# Patient Record
Sex: Male | Born: 1975 | Race: White | Hispanic: No | Marital: Single | Smoking: Current every day smoker
Health system: Southern US, Community
[De-identification: ages and names within clinical notes are randomized; demographics above are authoritative.]

## PROBLEM LIST (undated history)

## (undated) DIAGNOSIS — F250 Schizoaffective disorder, bipolar type: Secondary | ICD-10-CM

## (undated) DIAGNOSIS — K219 Gastro-esophageal reflux disease without esophagitis: Secondary | ICD-10-CM

## (undated) DIAGNOSIS — K259 Gastric ulcer, unspecified as acute or chronic, without hemorrhage or perforation: Secondary | ICD-10-CM

## (undated) DIAGNOSIS — F200 Paranoid schizophrenia: Secondary | ICD-10-CM

## (undated) DIAGNOSIS — I10 Essential (primary) hypertension: Secondary | ICD-10-CM

## (undated) DIAGNOSIS — F419 Anxiety disorder, unspecified: Secondary | ICD-10-CM

## (undated) HISTORY — PX: KNEE ARTHROSCOPY: SUR90

---

## 2000-03-19 ENCOUNTER — Emergency Department (HOSPITAL_COMMUNITY): Admission: EM | Admit: 2000-03-19 | Discharge: 2000-03-19 | Payer: Self-pay | Admitting: Emergency Medicine

## 2000-03-26 ENCOUNTER — Inpatient Hospital Stay (HOSPITAL_COMMUNITY): Admission: EM | Admit: 2000-03-26 | Discharge: 2000-03-28 | Payer: Self-pay | Admitting: Emergency Medicine

## 2000-03-26 ENCOUNTER — Encounter: Payer: Self-pay | Admitting: Emergency Medicine

## 2007-04-22 ENCOUNTER — Emergency Department (HOSPITAL_COMMUNITY): Admission: EM | Admit: 2007-04-22 | Discharge: 2007-04-23 | Payer: Self-pay | Admitting: Emergency Medicine

## 2008-02-06 ENCOUNTER — Emergency Department (HOSPITAL_COMMUNITY): Admission: EM | Admit: 2008-02-06 | Discharge: 2008-02-06 | Payer: Self-pay | Admitting: Emergency Medicine

## 2011-04-17 NOTE — Procedures (Signed)
[place]. [facility]  Patient:    Cameron Oliver, Cameron Oliver                        MRN: [id number] Proc. Date: 03/26/00 Adm. Date:  [id number] Attending:  Madaline Guthrie CC:         Madaline Guthrie, M.D.                           Procedure Report  PROCEDURE:  Esophagogastroduodenoscopy.  ENDOSCOPIST:  Everardo All. Madilyn Fireman, M.D.  ANESTHESIA:  INDICATIONS FOR PROCEDURE:  Nausea and vomiting of 9-10 days duration with report of recent hematemesis.  DESCRIPTION OF PROCEDURE:  The patient was placed in the left lateral decubitus  position and placed on the pulse monitor with continuous low flow oxygen delivered by nasal cannula.  He was sedated with 75 mg of IV Demerol and 10 mg of IV Versed. The Olympus video endoscope was passed under direct vision to the oropharynx and esophagus.  The esophagus was straight and of normal caliber, but was severely inflamed, especially distally with numerous erosions with exudate extending upward from the GE junction.  The GE junction initially appeared normal and there was o obvious hiatal hernia.  Later in the procedure however, there were several spontaneous relaxations of the LES and a sliding hiatal hernia was noted.  The esophagitis was grade 4 severity.  The squamocolumnar line appeared intact, although irregular at the apparent LES, and there was no obvious Barretts esophagus.  The stomach was entered and a small amount of liquid secretions were suctioned from the fundus.  Retroflexed view of the cardia was unremarkable. The fundus, body, antrum, and pylorus all appeared normal.  The duodenum was entered and both the bulb and second portion were well-inspected and appeared to be within normal limits.  The endoscope was then withdrawn and the patient was returned to the recovery room in stable condition.  He tolerated the procedure well and there were no immediate complications.  IMPRESSION:  Severe distal esophagitis,  presumed from gastroesophageal reflux.  PLAN:  Double dose proton pump inhibitor and IV Reglan. DD:  03/26/00 TD:  03/28/00 Job: [id number]

## 2011-04-17 NOTE — Discharge Summary (Signed)
[place]. [facility]  Patient:    Cameron Oliver, Cameron Oliver                        MRN: [id number] Adm. Date:  [id number] Disc. Date: [id number] Attending:  Madaline Guthrie Dictator:   Nolon Nations, M.D. CC:         Jannette Fogo, M.D.,             [facility]             Everardo All. Madilyn Fireman, M.D.                           Discharge Summary  DATE OF BIRTH:  1976-11-14  SERVICE:  Internal medicine teaching service.  ADMITTING DIAGNOSES: 1. Epigastric pain with hematemesis. 2. Schizophrenia. 3. Hypokalemia.  DISCHARGE DIAGNOSES: 1. Severe esophagitis. 2. Hiatal hernia. 3. Schizophrenia.  CONSULTS:  GI.  PROCEDURES: 1. Esophagogastroduodenoscopy. 2. Acute abdominal series. 3. CT of the abdomen.  HISTORY OF PRESENT ILLNESS:  Cameron Oliver is a 35 year old male with a 10-day history of nausea and vomiting 10 to 15 times a day.  He also had upper abdominal chest pain which was worse with vomiting.  He had one episode of hematemesis the day before admission.  His events were preceded by heavy alcohol consumption.  He had no diarrhea, constipation, melena, or hematochezia.  He was seen in the ED in [place] twice prior to admission and at [facility] once.  He was given Phenergan and H2 blockers along with a PPI without much relief.  He does report dysphagia and odynophagia.  PAST MEDICAL HISTORY:  Significant for schizophrenia treated with Haldol injections q. month as well as Elavil q.h.s., ? Cogentin.  ADMISSION LABS:  Significant for a potassium of 2.8, lipase 21, amylase 79. An acute abdominal series revealed no free air, no signs of obstruction, no perforation, or air fluid levels.  His CT of the abdomen showed some mild colonic thickening but was otherwise normal.  HOSPITAL COURSE:  Cameron Oliver was admitted with a differential diagnosis including a Mallory-Weiss tear versus an alcohol or drug induced gastritis/esophagitis.  GI was  consulted while hydration was begun along with Phenergan for vomiting, and a PPI.  He did have an elevated T. bili, ALT, which was likely secondary to hemoconcentration or effect of Haldol.  GI performed an EGD which showed severe esophagitis as well as a hiatal hernia. They recommended double dose PPI with Reglan.  He was started on a full-liquid diet with advancement as tolerated.  He did quite well and was discharged on April 29.  The teaching service recommended one more day of inpatient treatment to follow a hemoglobin which went from 18.6 on admission to 14.3 on day of discharge.  However, the patient insisted on leaving.  Patient was instructed to return if he was feeling weak or dizzy or began with repeat vomiting.  He was sent home with a prescription for Protonix plus two samples and call for follow-up with Dr. Barbaraann Rondo in two to three weeks.  DISCHARGE CONDITION:  Stable.  Discharge to home.  DISCHARGE MEDICATIONS: 1. Protonix 400 mg one p.o. q.d. 2. Haldol injection q. month, next one due Apr 24, 2000. 3. Elavil 100 mg q.h.s. 4. Cogentin, continue home dose.  FOLLOW-UP:  [facility] outpatient [facility] for follow-up with Dr. Barbaraann Rondo in two to three weeks. DD:  04/04/00 TD:  04/05/00 Job: [id number]

## 2011-08-24 LAB — WOUND CULTURE

## 2014-04-05 ENCOUNTER — Emergency Department (HOSPITAL_COMMUNITY)
Admission: EM | Admit: 2014-04-05 | Discharge: 2014-04-05 | Disposition: A | Discharge status: Home / Self Care | Payer: Medicare Other | Attending: Emergency Medicine | Admitting: Emergency Medicine

## 2014-04-05 ENCOUNTER — Encounter (HOSPITAL_COMMUNITY): Payer: Self-pay | Admitting: Emergency Medicine

## 2014-04-05 ENCOUNTER — Emergency Department (HOSPITAL_COMMUNITY): Payer: Medicare Other

## 2014-04-05 DIAGNOSIS — IMO0002 Reserved for concepts with insufficient information to code with codable children: Secondary | ICD-10-CM | POA: Insufficient documentation

## 2014-04-05 DIAGNOSIS — X500XXA Overexertion from strenuous movement or load, initial encounter: Secondary | ICD-10-CM | POA: Insufficient documentation

## 2014-04-05 DIAGNOSIS — Y929 Unspecified place or not applicable: Secondary | ICD-10-CM | POA: Insufficient documentation

## 2014-04-05 DIAGNOSIS — Y9389 Activity, other specified: Secondary | ICD-10-CM | POA: Insufficient documentation

## 2014-04-05 DIAGNOSIS — S86919A Strain of unspecified muscle(s) and tendon(s) at lower leg level, unspecified leg, initial encounter: Secondary | ICD-10-CM

## 2014-04-05 HISTORY — DX: Paranoid schizophrenia: F20.0

## 2014-04-05 HISTORY — DX: Gastric ulcer, unspecified as acute or chronic, without hemorrhage or perforation: K25.9

## 2014-04-05 HISTORY — DX: Essential (primary) hypertension: I10

## 2014-04-05 HISTORY — DX: Gastro-esophageal reflux disease without esophagitis: K21.9

## 2014-04-05 MED ORDER — HYDROCODONE-ACETAMINOPHEN 5-325 MG PO TABS: 2.0000 | ORAL_TABLET | ORAL | Status: DC | PRN

## 2014-04-05 MED ORDER — IBUPROFEN 800 MG PO TABS: 800.0000 mg | ORAL_TABLET | Freq: Three times a day (TID) | ORAL | Status: DC

## 2014-04-05 MED ORDER — HYDROCODONE-ACETAMINOPHEN 5-325 MG PO TABS
2.0000 | ORAL_TABLET | Freq: Once | ORAL | Status: AC
  Administered 2014-04-05: 2 via ORAL
  Filled 2014-04-05: qty 2

## 2014-04-05 NOTE — ED Notes (Addendum)
Pt from home, c/o knee pain after hearing pop in left knee, Pt unable to straighten knee fully. No gross deformities noted. Pt received 150 mcg fentanyl

## 2014-04-05 NOTE — Discharge Instructions (Signed)
Ligament Sprain A ligament sprain is when the bands of tissue that hold bones together (ligament) are stretched. HOME CARE   Rest the injured area.  Start using the joint when told to by your doctor.  Keep the injured area raised (elevated) above the level of the heart. This may lessen puffiness (swelling).  Put ice on the injured area.  Put ice in a plastic bag.  Place a towel between your skin and the bag.  Leave the ice on for 15-20 minutes, 03-04 times a day.  Wear a splint, cast, or an elastic bandage as told by your doctor.  Only take medicine as told by your doctor.  Use crutches as told by your doctor. Do not put weight on the injured joint until told to by your doctor. GET HELP RIGHT AWAY IF:   You have more bruising, puffiness, or pain.  The leg was injured and the toes are cold, tingling, numb, or blue.  The arm was injured and the fingers are cold, tingling, numb, or blue.  The pain is not helped with medicine.  The pain gets worse. MAKE SURE YOU:   Understand these instructions.  Will watch this condition.  Will get help right away if you are not doing well or get worse. Document Released: 05/04/2008 Document Revised: 09/06/2013 Document Reviewed: 05/04/2008 [facility] Patient Information 2014 [place]Care, [place]. Knee, Cartilage and its Function The menisci are made of tough cartilage, and fit between the surfaces of the bones upon which they rest. The menisci are semi lunar (C-shaped) and have a wedged profile. The wedged profile helps the stability of the joint by keeping the rounded femur surface from sliding off the flat tibial surface. The menisci are nourished (fed) by small blood vessels; but there is also a large area in the center of the meniscus that does not have a good blood supply (avascular). This presents a problem when there is an injury to the meniscus, as areas without good blood supply heal poorly. When there is a torn cartilage in the knee,  surgery is often required to fix this. This is usually done with an arthroscopy (a surgical procedure less invasive than open surgery). Some times open surgery of the knee is required if there is other damage which has occurred. The medial meniscus rests on the medial tibial plateau. The tibia is the large bone in your lower leg (the shin bone). The medial tibial plateau is the upper end of the bone making up the inner part of your knee. The lateral meniscus serves the same purpose and is located on the outside of the knee. The menisci help to distribute your body weight across the knee joint. Without the meniscus present, the weight of your body would be unevenly applied to the bones in your legs (the femur and tibia). The femur is the large bone in your thigh. This uneven weight distribution would cause increased wear and tear on the cartilage covering the bones, leading to early damage of these areas. The presence of the menisci cartilage is necessary for a healthy knee. PURPOSE OF THE KNEE CARTILAGE The knee joint is made up of three bones: the thigh bone (femur), the shin bone (tibia), and the knee cap (patella). The surfaces of these bones at the knee joint are covered with cartilage. This smooth, slippery surface allows the bones to slide against each other without causing bone damage. The meniscus sits between these cartilaginous surfaces of the bones (femur and tibia). It distributes the weight  evenly in the joints and helps with the stability of the joint (keeps the joint steady). HOME CARE INSTRUCTIONS After surgery:  Use crutches as instructed.  Once home, an ice pack applied to your injury may help with discomfort and keep the swelling down. An ice pack can be used for 15-20 minutes 03-04 times per day for the first 2-3 days or as instructed.  Only take over-the-counter or prescription medicines for pain, discomfort, or fever as directed by your caregiver.  Call if you do not have relief of  pain with medications or if there is an increase in pain.  Call if your foot becomes cold or blue.  Call if your knee gets stuck in a fixed position and will not move.  You may resume normal diet and activities as directed.  Make sure to keep your appointment with your follow-up caregiver. This injury may require further evaluation and treatment beyond the treatment you received today. MAKE SURE YOU:   Understand these instructions.  Will watch your condition.  Will get help right away if you are not doing well or get worse. Document Released: 05/08/2002 Document Revised: 02/08/2012 Document Reviewed: 09/18/2009 [facility]oniumExitCare Patient Information 2014 [place]ExitCare, [place]LL[place].

## 2014-04-05 NOTE — ED Provider Notes (Addendum)
CSN: [id number]     Arrival date & time 04/05/14  1749 History   First MD Initiated Contact with Patient 04/05/14 1750     No chief complaint on file.    (Consider location/radiation/quality/duration/timing/severity/associated sxs/prior Treatment) HPI Comments: Patient presents to the ER by him as her knee injury. Patient reports that he stood up from a sitting position and felt a pop in his right knee, then the knee was locked up. He cannot bend it. Patient reports severe pain on the outside and lower portion of the knee. It worsens with attempts to move the knee.   No past medical history on file. No past surgical history on file. No family history on file. History  Substance Use Topics  . Smoking status: Not on file  . Smokeless tobacco: Not on file  . Alcohol Use: Not on file    Review of Systems  Musculoskeletal: Positive for arthralgias.  Skin: Negative.   All other systems reviewed and are negative.     Allergies  Review of patient's allergies indicates not on file.  Home Medications   Prior to Admission medications   Not on File   BP 149/85  Pulse 93  Temp(Src) 98.6 F (37 C) (Oral)  Resp 18  SpO2 97% Physical Exam  Constitutional: He is oriented to person, place, and time. He appears well-developed and well-nourished. No distress.  HENT:  Head: Normocephalic and atraumatic.  Right Ear: Hearing normal.  Left Ear: Hearing normal.  Nose: Nose normal.  Mouth/Throat: Oropharynx is clear and moist and mucous membranes are normal.  Eyes: Conjunctivae and EOM are normal. Pupils are equal, round, and reactive to light.  Neck: Normal range of motion. Neck supple.  Cardiovascular: Regular rhythm, S1 normal and S2 normal.  Exam reveals no gallop and no friction rub.   No murmur heard. Pulses:      Dorsalis pedis pulses are 2+ on the right side.  Pulmonary/Chest: Effort normal and breath sounds normal. No respiratory distress. He exhibits no tenderness.   Abdominal: Soft. Normal appearance and bowel sounds are normal. There is no hepatosplenomegaly. There is no tenderness. There is no rebound, no guarding, no tenderness at McBurney's point and negative Murphy's sign. No hernia.  Musculoskeletal:       Right shoulder: Decreased range of motion: Painful inhibition.       Right knee: He exhibits decreased range of motion (Painful inhibition). He exhibits no swelling, no effusion, no ecchymosis, no deformity, no erythema and normal alignment. Tenderness found. Lateral joint line tenderness noted.  Able to actively extend the knee, normal quadricep tendon function  Neurological: He is alert and oriented to person, place, and time. He has normal strength. No cranial nerve deficit or sensory deficit. Coordination normal. GCS eye subscore is 4. GCS verbal subscore is 5. GCS motor subscore is 6.  Skin: Skin is warm, dry and intact. No rash noted. No cyanosis.  Psychiatric: He has a normal mood and affect. His speech is normal and behavior is normal. Thought content normal.    ED Course  Procedures (including critical care time) Labs Review Labs Reviewed - No data to display  Imaging Review No results found.   EKG Interpretation None      MDM   Final diagnoses:  None    Patient presents to the ER for evaluation of acute onset right knee pain. Patient reports that he tried to stand up and felt sudden pain and locking of the right knee. Examination, however, is  fairly unremarkable. No swelling, redness or effusion. No concern for septic joint. There is no concern for dislocation based on his exam in the stated history. X-ray was performed and was negative. History is most consistent with meniscus tear, will treat with analgesia, and knee immobilizer, followup in orthopedics.    Gilda Creasehristopher J. Jesusita Jocelyn, MD 04/05/14 Serena Croissant1928  Gilda Creasehristopher J. Revel Stellmach, MD 04/05/14 [phone]

## 2014-04-05 NOTE — ED Notes (Signed)
Discharge instructions reviewed with pt. Pt verbalized understanding.   

## 2014-11-25 ENCOUNTER — Encounter (HOSPITAL_COMMUNITY): Payer: Self-pay

## 2014-11-25 ENCOUNTER — Emergency Department (HOSPITAL_COMMUNITY)
Admission: EM | Admit: 2014-11-25 | Discharge: 2014-11-25 | Disposition: A | Discharge status: Home / Self Care | Payer: Medicare Other | Attending: Emergency Medicine | Admitting: Emergency Medicine

## 2014-11-25 DIAGNOSIS — F200 Paranoid schizophrenia: Secondary | ICD-10-CM | POA: Insufficient documentation

## 2014-11-25 DIAGNOSIS — Z720 Tobacco use: Secondary | ICD-10-CM | POA: Diagnosis not present

## 2014-11-25 DIAGNOSIS — R51 Headache: Secondary | ICD-10-CM | POA: Diagnosis present

## 2014-11-25 DIAGNOSIS — G43909 Migraine, unspecified, not intractable, without status migrainosus: Secondary | ICD-10-CM | POA: Diagnosis not present

## 2014-11-25 DIAGNOSIS — K219 Gastro-esophageal reflux disease without esophagitis: Secondary | ICD-10-CM | POA: Diagnosis not present

## 2014-11-25 DIAGNOSIS — I10 Essential (primary) hypertension: Secondary | ICD-10-CM | POA: Diagnosis not present

## 2014-11-25 DIAGNOSIS — Z79899 Other long term (current) drug therapy: Secondary | ICD-10-CM | POA: Diagnosis not present

## 2014-11-25 DIAGNOSIS — R519 Headache, unspecified: Secondary | ICD-10-CM

## 2014-11-25 MED ORDER — ONDANSETRON HCL 4 MG/2ML IJ SOLN
4.0000 mg | Freq: Once | INTRAMUSCULAR | Status: AC
  Administered 2014-11-25: 4 mg via INTRAVENOUS
  Filled 2014-11-25: qty 2

## 2014-11-25 MED ORDER — METOCLOPRAMIDE HCL 10 MG PO TABS: 10.0000 mg | ORAL_TABLET | Freq: Three times a day (TID) | ORAL | Status: DC | PRN

## 2014-11-25 MED ORDER — KETOROLAC TROMETHAMINE 30 MG/ML IJ SOLN
30.0000 mg | Freq: Once | INTRAMUSCULAR | Status: AC
  Administered 2014-11-25: 30 mg via INTRAVENOUS
  Filled 2014-11-25: qty 1

## 2014-11-25 MED ORDER — FENTANYL CITRATE 0.05 MG/ML IJ SOLN
50.0000 ug | Freq: Once | INTRAMUSCULAR | Status: AC
  Administered 2014-11-25: 50 ug via INTRAVENOUS
  Filled 2014-11-25: qty 2

## 2014-11-25 MED ORDER — SODIUM CHLORIDE 0.9 % IV BOLUS (SEPSIS)
1000.0000 mL | Freq: Once | INTRAVENOUS | Status: AC
  Administered 2014-11-25: 1000 mL via INTRAVENOUS

## 2014-11-25 MED ORDER — METOCLOPRAMIDE HCL 5 MG/ML IJ SOLN
10.0000 mg | Freq: Once | INTRAMUSCULAR | Status: AC
  Administered 2014-11-25: 10 mg via INTRAVENOUS
  Filled 2014-11-25: qty 2

## 2014-11-25 MED ORDER — DIPHENHYDRAMINE HCL 50 MG/ML IJ SOLN
50.0000 mg | Freq: Once | INTRAMUSCULAR | Status: AC
  Administered 2014-11-25: 50 mg via INTRAVENOUS
  Filled 2014-11-25: qty 1

## 2014-11-25 NOTE — ED Notes (Signed)
Per EMS: Pt reports HA that started at 2300 last night with nausea and vomiting and feeling dizzy. Pt was given 4mg  zofran en route. NS on monitor. Pt rates pain 7/10. Denies any sensitivity to light or noise.

## 2014-11-25 NOTE — ED Provider Notes (Signed)
CSN: [id number]     Arrival date & time 11/25/14  0447 History   First MD Initiated Contact with Patient 11/25/14 0636     Chief Complaint  Patient presents with  . Headache     (Consider location/radiation/quality/duration/timing/severity/associated sxs/prior Treatment) HPI  Cameron Oliver is a 38 y.o. male with past medical history of migraines, hypertension presenting today with headache. Patient states this began last night at 11 PM and is typical of his normal migraines. He states she's had other headaches that worsened this one. He vomited 3 or 4 times without any blood. She also admits to being dizzy. He denies neurological symptoms with blurry vision muscle weakness or numbness tingling. Patient is having no fevers or recent illness. Nothing has made this better or worse.  He has no further complaints.   10 Systems reviewed and are negative for acute change except as noted in the HPI.    Past Medical History  Diagnosis Date  . Hypertension   . Stomach ulcer   . GERD (gastroesophageal reflux disease)   . Schizophrenia, paranoid    History reviewed. No pertinent past surgical history. History reviewed. No pertinent family history. History  Substance Use Topics  . Smoking status: Current Every Day Smoker -- 1.00 packs/day    Types: Cigarettes  . Smokeless tobacco: Not on file  . Alcohol Use: No    Review of Systems    Allergies  Bee venom  Home Medications   Prior to Admission medications   Medication Sig Start Date End Date Taking? Authorizing Provider  acetaminophen (TYLENOL) 500 MG tablet Take 1,000 mg by mouth every 4 (four) hours as needed for mild pain.   Yes Historical Provider, MD  ARIPiprazole (ABILIFY) 5 MG tablet Take 5 mg by mouth daily.   Yes Historical Provider, MD  busPIRone (BUSPAR) 7.5 MG tablet Take 7.5 mg by mouth 2 (two) times daily.   Yes Historical Provider, MD  cyclobenzaprine (FLEXERIL) 10 MG tablet Take 10 mg by mouth at bedtime.   Yes  Historical Provider, MD  lisinopril (PRINIVIL,ZESTRIL) 20 MG tablet Take 20 mg by mouth daily.   Yes Historical Provider, MD  omeprazole (PRILOSEC) 20 MG capsule Take 20 mg by mouth daily.   Yes Historical Provider, MD  HYDROcodone-acetaminophen (NORCO/VICODIN) 5-325 MG per tablet Take 2 tablets by mouth every 4 (four) hours as needed for moderate pain. Patient not taking: Reported on 11/25/2014 04/05/14   Gilda Creasehristopher J. Pollina, MD  ibuprofen (ADVIL,MOTRIN) 800 MG tablet Take 1 tablet (800 mg total) by mouth 3 (three) times daily. Patient not taking: Reported on 11/25/2014 04/05/14   Gilda Creasehristopher J. Pollina, MD   BP 149/87 mmHg  Pulse 66  Temp(Src) 98.7 F (37.1 C) (Oral)  Resp 18  SpO2 97% Physical Exam  Constitutional: He is oriented to person, place, and time. Vital signs are normal. He appears well-developed and well-nourished.  Non-toxic appearance. He does not appear ill. No distress.  HENT:  Head: Normocephalic and atraumatic.  Nose: Nose normal.  Mouth/Throat: Oropharynx is clear and moist. No oropharyngeal exudate.  Eyes: Conjunctivae and EOM are normal. Pupils are equal, round, and reactive to light. No scleral icterus.  Neck: Normal range of motion. Neck supple. No tracheal deviation, no edema, no erythema and normal range of motion present. No thyroid mass and no thyromegaly present.  Cardiovascular: Normal rate, regular rhythm, S1 normal, S2 normal, normal heart sounds, intact distal pulses and normal pulses.  Exam reveals no gallop and no friction rub.  No murmur heard. Pulses:      Radial pulses are 2+ on the right side, and 2+ on the left side.       Dorsalis pedis pulses are 2+ on the right side, and 2+ on the left side.  Pulmonary/Chest: Effort normal and breath sounds normal. No respiratory distress. He has no wheezes. He has no rhonchi. He has no rales.  Abdominal: Soft. Normal appearance and bowel sounds are normal. He exhibits no distension, no ascites and no mass.  There is no hepatosplenomegaly. There is no tenderness. There is no rebound, no guarding and no CVA tenderness.  Musculoskeletal: Normal range of motion. He exhibits edema. He exhibits no tenderness.  Lymphadenopathy:    He has no cervical adenopathy.  Neurological: He is alert and oriented to person, place, and time. He has normal strength. No cranial nerve deficit or sensory deficit. He exhibits normal muscle tone. GCS eye subscore is 4. GCS verbal subscore is 5. GCS motor subscore is 6.  Normal strength and sensation 4 extremities. Normal cerebellar testing, normal gait.  Skin: Skin is warm, dry and intact. No petechiae and no rash noted. He is not diaphoretic. No erythema. No pallor.  Psychiatric: He has a normal mood and affect. His behavior is normal. Judgment normal.  Nursing note and vitals reviewed.   ED Course  Procedures (including critical care time) Labs Review Labs Reviewed - No data to display  Imaging Review No results found.   EKG Interpretation None      MDM   Final diagnoses:  None    Patient since emergency department for headache which she states is consistent with his normal migraines. He was given Reglan Toradol and Benadryl for treatment. Patient has no red flag symptoms such as fever and he denies his having been more severe than his normal headache.  Upon repeat evaluation patient was found resting comfortably in the room in no acute distress. He states that his headache is much improved and is requesting Reglan tablets to go home with. To exam remains normal. His vital signs were within his normal limits and he is safe for discharge.  Tomasita CrumbleAdeleke Ewen Varnell, MD 11/25/14 [phone]

## 2014-11-25 NOTE — Discharge Instructions (Signed)
Headaches, Frequently Asked Questions Cameron Oliver,  you were seen today for a headache. Take Reglan as prescribed and follow-up with a primary physician within 3 days for continued management. If symptoms worsen come back to emergency department immediately. Thank you. MIGRAINE HEADACHES Q: What is migraine? What causes it? How can I treat it? A: Generally, migraine headaches begin as a dull ache. Then they develop into a constant, throbbing, and pulsating pain. You may experience pain at the temples. You may experience pain at the front or back of one or both sides of the head. The pain is usually accompanied by a combination of:  Nausea.  Vomiting.  Sensitivity to light and noise. Some people (about 15%) experience an aura (see below) before an attack. The cause of migraine is believed to be chemical reactions in the brain. Treatment for migraine may include over-the-counter or prescription medications. It may also include self-help techniques. These include relaxation training and biofeedback.  Q: What is an aura? A: About 15% of people with migraine get an "aura". This is a sign of neurological symptoms that occur before a migraine headache. You may see wavy or jagged lines, dots, or flashing lights. You might experience tunnel vision or blind spots in one or both eyes. The aura can include visual or auditory hallucinations (something imagined). It may include disruptions in smell (such as strange odors), taste or touch. Other symptoms include:  Numbness.  A "pins and needles" sensation.  Difficulty in recalling or speaking the correct word. These neurological events may last as long as 60 minutes. These symptoms will fade as the headache begins. Q: What is a trigger? A: Certain physical or environmental factors can lead to or "trigger" a migraine. These include:  Foods.  Hormonal changes.  Weather.  Stress. It is important to remember that triggers are different for everyone. To  help prevent migraine attacks, you need to figure out which triggers affect you. Keep a headache diary. This is a good way to track triggers. The diary will help you talk to your healthcare professional about your condition. Q: Does weather affect migraines? A: Bright sunshine, hot, humid conditions, and drastic changes in barometric pressure may lead to, or "trigger," a migraine attack in some people. But studies have shown that weather does not act as a trigger for everyone with migraines. Q: What is the link between migraine and hormones? A: Hormones start and regulate many of your body's functions. Hormones keep your body in balance within a constantly changing environment. The levels of hormones in your body are unbalanced at times. Examples are during menstruation, pregnancy, or menopause. That can lead to a migraine attack. In fact, about three quarters of all women with migraine report that their attacks are related to the menstrual cycle.  Q: Is there an increased risk of stroke for migraine sufferers? A: The likelihood of a migraine attack causing a stroke is very remote. That is not to say that migraine sufferers cannot have a stroke associated with their migraines. In persons under age 17, the most common associated factor for stroke is migraine headache. But over the course of a person's normal life span, the occurrence of migraine headache may actually be associated with a reduced risk of dying from cerebrovascular disease due to stroke.  Q: What are acute medications for migraine? A: Acute medications are used to treat the pain of the headache after it has started. Examples over-the-counter medications, NSAIDs, ergots, and triptans.  Q: What are the triptans?  A: Triptans are the newest class of abortive medications. They are specifically targeted to treat migraine. Triptans are vasoconstrictors. They moderate some chemical reactions in the brain. The triptans work on receptors in your brain.  Triptans help to restore the balance of a neurotransmitter called serotonin. Fluctuations in levels of serotonin are thought to be a main cause of migraine.  Q: Are over-the-counter medications for migraine effective? A: Over-the-counter, or "OTC," medications may be effective in relieving mild to moderate pain and associated symptoms of migraine. But you should see your caregiver before beginning any treatment regimen for migraine.  Q: What are preventive medications for migraine? A: Preventive medications for migraine are sometimes referred to as "prophylactic" treatments. They are used to reduce the frequency, severity, and length of migraine attacks. Examples of preventive medications include antiepileptic medications, antidepressants, beta-blockers, calcium channel blockers, and NSAIDs (nonsteroidal anti-inflammatory drugs). Q: Why are anticonvulsants used to treat migraine? A: During the past few years, there has been an increased interest in antiepileptic drugs for the prevention of migraine. They are sometimes referred to as "anticonvulsants". Both epilepsy and migraine may be caused by similar reactions in the brain.  Q: Why are antidepressants used to treat migraine? A: Antidepressants are typically used to treat people with depression. They may reduce migraine frequency by regulating chemical levels, such as serotonin, in the brain.  Q: What alternative therapies are used to treat migraine? A: The term "alternative therapies" is often used to describe treatments considered outside the scope of conventional Western medicine. Examples of alternative therapy include acupuncture, acupressure, and yoga. Another common alternative treatment is herbal therapy. Some herbs are believed to relieve headache pain. Always discuss alternative therapies with your caregiver before proceeding. Some herbal products contain arsenic and other toxins. TENSION HEADACHES Q: What is a tension-type headache? What  causes it? How can I treat it? A: Tension-type headaches occur randomly. They are often the result of temporary stress, anxiety, fatigue, or anger. Symptoms include soreness in your temples, a tightening band-like sensation around your head (a "vice-like" ache). Symptoms can also include a pulling feeling, pressure sensations, and contracting head and neck muscles. The headache begins in your forehead, temples, or the back of your head and neck. Treatment for tension-type headache may include over-the-counter or prescription medications. Treatment may also include self-help techniques such as relaxation training and biofeedback. CLUSTER HEADACHES Q: What is a cluster headache? What causes it? How can I treat it? A: Cluster headache gets its name because the attacks come in groups. The pain arrives with little, if any, warning. It is usually on one side of the head. A tearing or bloodshot eye and a runny nose on the same side of the headache may also accompany the pain. Cluster headaches are believed to be caused by chemical reactions in the brain. They have been described as the most severe and intense of any headache type. Treatment for cluster headache includes prescription medication and oxygen. SINUS HEADACHES Q: What is a sinus headache? What causes it? How can I treat it? A: When a cavity in the bones of the face and skull (a sinus) becomes inflamed, the inflammation will cause localized pain. This condition is usually the result of an allergic reaction, a tumor, or an infection. If your headache is caused by a sinus blockage, such as an infection, you will probably have a fever. An x-ray will confirm a sinus blockage. Your caregiver's treatment might include antibiotics for the infection, as well as antihistamines or  decongestants.  REBOUND HEADACHES Q: What is a rebound headache? What causes it? How can I treat it? A: A pattern of taking acute headache medications too often can lead to a condition  known as "rebound headache." A pattern of taking too much headache medication includes taking it more than 2 days per week or in excessive amounts. That means more than the label or a caregiver advises. With rebound headaches, your medications not only stop relieving pain, they actually begin to cause headaches. Doctors treat rebound headache by tapering the medication that is being overused. Sometimes your caregiver will gradually substitute a different type of treatment or medication. Stopping may be a challenge. Regularly overusing a medication increases the potential for serious side effects. Consult a caregiver if you regularly use headache medications more than 2 days per week or more than the label advises. ADDITIONAL QUESTIONS AND ANSWERS Q: What is biofeedback? A: Biofeedback is a self-help treatment. Biofeedback uses special equipment to monitor your body's involuntary physical responses. Biofeedback monitors:  Breathing.  Pulse.  Heart rate.  Temperature.  Muscle tension.  Brain activity. Biofeedback helps you refine and perfect your relaxation exercises. You learn to control the physical responses that are related to stress. Once the technique has been mastered, you do not need the equipment any more. Q: Are headaches hereditary? A: Four out of five (80%) of people that suffer report a family history of migraine. Scientists are not sure if this is genetic or a family predisposition. Despite the uncertainty, a child has a 50% chance of having migraine if one parent suffers. The child has a 75% chance if both parents suffer.  Q: Can children get headaches? A: By the time they reach high school, most young people have experienced some type of headache. Many safe and effective approaches or medications can prevent a headache from occurring or stop it after it has begun.  Q: What type of doctor should I see to diagnose and treat my headache? A: Start with your primary caregiver. Discuss  his or her experience and approach to headaches. Discuss methods of classification, diagnosis, and treatment. Your caregiver may decide to recommend you to a headache specialist, depending upon your symptoms or other physical conditions. Having diabetes, allergies, etc., may require a more comprehensive and inclusive approach to your headache. The National Headache Foundation will provide, upon request, a list of [facility] physician members in your state. Document Released: 02/06/2004 Document Revised: 02/08/2012 Document Reviewed: 07/16/2008 [facility] Patient Information 2015 [place], [place]. This information is not intended to replace advice given to you by your health care provider. Make sure you discuss any questions you have with your health care provider.

## 2014-11-25 NOTE — ED Notes (Signed)
Bed: [id number] Expected date: 11/25/14 Expected time:  Means of arrival:  Comments: EMS HA/N/V

## 2015-08-10 ENCOUNTER — Inpatient Hospital Stay (HOSPITAL_COMMUNITY): Payer: Medicare Other

## 2015-08-10 ENCOUNTER — Inpatient Hospital Stay (HOSPITAL_COMMUNITY)
Admission: EM | Admit: 2015-08-10 | Discharge: 2015-08-12 | DRG: 683 | Disposition: A | Discharge status: Home / Self Care | Payer: Medicare Other | Attending: Internal Medicine | Admitting: Internal Medicine

## 2015-08-10 ENCOUNTER — Emergency Department (HOSPITAL_COMMUNITY): Payer: Medicare Other

## 2015-08-10 ENCOUNTER — Encounter (HOSPITAL_COMMUNITY): Payer: Self-pay

## 2015-08-10 DIAGNOSIS — F419 Anxiety disorder, unspecified: Secondary | ICD-10-CM

## 2015-08-10 DIAGNOSIS — R7989 Other specified abnormal findings of blood chemistry: Secondary | ICD-10-CM | POA: Diagnosis present

## 2015-08-10 DIAGNOSIS — F17210 Nicotine dependence, cigarettes, uncomplicated: Secondary | ICD-10-CM | POA: Diagnosis present

## 2015-08-10 DIAGNOSIS — E860 Dehydration: Secondary | ICD-10-CM | POA: Diagnosis present

## 2015-08-10 DIAGNOSIS — F259 Schizoaffective disorder, unspecified: Secondary | ICD-10-CM

## 2015-08-10 DIAGNOSIS — I10 Essential (primary) hypertension: Secondary | ICD-10-CM

## 2015-08-10 DIAGNOSIS — F200 Paranoid schizophrenia: Secondary | ICD-10-CM | POA: Diagnosis not present

## 2015-08-10 DIAGNOSIS — I951 Orthostatic hypotension: Secondary | ICD-10-CM | POA: Diagnosis present

## 2015-08-10 DIAGNOSIS — R0602 Shortness of breath: Secondary | ICD-10-CM

## 2015-08-10 DIAGNOSIS — R0600 Dyspnea, unspecified: Secondary | ICD-10-CM

## 2015-08-10 DIAGNOSIS — D72829 Elevated white blood cell count, unspecified: Secondary | ICD-10-CM | POA: Diagnosis present

## 2015-08-10 DIAGNOSIS — N179 Acute kidney failure, unspecified: Secondary | ICD-10-CM | POA: Diagnosis present

## 2015-08-10 DIAGNOSIS — K921 Melena: Secondary | ICD-10-CM | POA: Diagnosis present

## 2015-08-10 DIAGNOSIS — R55 Syncope and collapse: Secondary | ICD-10-CM | POA: Diagnosis present

## 2015-08-10 DIAGNOSIS — K219 Gastro-esophageal reflux disease without esophagitis: Secondary | ICD-10-CM | POA: Diagnosis present

## 2015-08-10 DIAGNOSIS — F250 Schizoaffective disorder, bipolar type: Secondary | ICD-10-CM

## 2015-08-10 DIAGNOSIS — K210 Gastro-esophageal reflux disease with esophagitis: Secondary | ICD-10-CM | POA: Diagnosis not present

## 2015-08-10 DIAGNOSIS — R112 Nausea with vomiting, unspecified: Secondary | ICD-10-CM

## 2015-08-10 HISTORY — DX: Schizoaffective disorder, unspecified: F25.9

## 2015-08-10 HISTORY — DX: Essential (primary) hypertension: I10

## 2015-08-10 HISTORY — DX: Anxiety disorder, unspecified: F41.9

## 2015-08-10 HISTORY — DX: Schizoaffective disorder, bipolar type: F25.0

## 2015-08-10 LAB — BASIC METABOLIC PANEL
Anion gap: 17 — ABNORMAL HIGH (ref 5–15)
Anion gap: 7 (ref 5–15)
BUN: 20 mg/dL (ref 6–20)
BUN: 12 mg/dL (ref 6–20)
CHLORIDE: 97 mmol/L — AB (ref 101–111)
CHLORIDE: 106 mmol/L (ref 101–111)
CO2: 26 mmol/L (ref 22–32)
CO2: 26 mmol/L (ref 22–32)
CREATININE: 3.79 mg/dL — AB (ref 0.61–1.24)
CREATININE: 1.79 mg/dL — AB (ref 0.61–1.24)
Calcium: 9.5 mg/dL (ref 8.9–10.3)
Calcium: 8.2 mg/dL — ABNORMAL LOW (ref 8.9–10.3)
GFR calc Af Amer: 22 mL/min — ABNORMAL LOW (ref >60)
GFR calc Af Amer: 54 mL/min — ABNORMAL LOW (ref >60)
GFR calc non Af Amer: 19 mL/min — ABNORMAL LOW (ref >60)
GFR calc non Af Amer: 47 mL/min — ABNORMAL LOW (ref >60)
GLUCOSE: 95 mg/dL (ref 65–99)
Glucose, Bld: 125 mg/dL — ABNORMAL HIGH (ref 65–99)
POTASSIUM: 3.9 mmol/L (ref 3.5–5.1)
Potassium: 4 mmol/L (ref 3.5–5.1)
SODIUM: 139 mmol/L (ref 135–145)
Sodium: 140 mmol/L (ref 135–145)

## 2015-08-10 LAB — CBC
HCT: 48.9 % (ref 39.0–52.0)
HEMATOCRIT: 42.6 % (ref 39.0–52.0)
Hemoglobin: 17.5 g/dL — ABNORMAL HIGH (ref 13.0–17.0)
Hemoglobin: 14.5 g/dL (ref 13.0–17.0)
MCH: 31.8 pg (ref 26.0–34.0)
MCH: 31.3 pg (ref 26.0–34.0)
MCHC: 35.8 g/dL (ref 30.0–36.0)
MCHC: 34 g/dL (ref 30.0–36.0)
MCV: 88.7 fL (ref 78.0–100.0)
MCV: 92 fL (ref 78.0–100.0)
PLATELETS: 376 10*3/uL (ref 150–400)
PLATELETS: 284 10*3/uL (ref 150–400)
RBC: 5.51 MIL/uL (ref 4.22–5.81)
RBC: 4.63 MIL/uL (ref 4.22–5.81)
RDW: 13 % (ref 11.5–15.5)
RDW: 13.3 % (ref 11.5–15.5)
WBC: 20 10*3/uL — ABNORMAL HIGH (ref 4.0–10.5)
WBC: 13.7 10*3/uL — AB (ref 4.0–10.5)

## 2015-08-10 LAB — URINALYSIS, ROUTINE W REFLEX MICROSCOPIC
GLUCOSE, UA: NEGATIVE mg/dL
KETONES UR: NEGATIVE mg/dL
Leukocytes, UA: NEGATIVE
Nitrite: NEGATIVE
Protein, ur: >300 mg/dL — AB
Specific Gravity, Urine: >1.03 — ABNORMAL HIGH (ref 1.005–1.030)
Urobilinogen, UA: 0.2 mg/dL (ref 0.0–1.0)
pH: 5.5 (ref 5.0–8.0)

## 2015-08-10 LAB — HEPATIC FUNCTION PANEL
ALK PHOS: 90 U/L (ref 38–126)
ALT: 25 U/L (ref 17–63)
AST: 22 U/L (ref 15–41)
Albumin: 4.1 g/dL (ref 3.5–5.0)
BILIRUBIN DIRECT: 0.1 mg/dL (ref 0.1–0.5)
BILIRUBIN INDIRECT: 0.7 mg/dL (ref 0.3–0.9)
TOTAL PROTEIN: 7.1 g/dL (ref 6.5–8.1)
Total Bilirubin: 0.8 mg/dL (ref 0.3–1.2)

## 2015-08-10 LAB — TYPE AND SCREEN
ABO/RH(D): O POS
Antibody Screen: NEGATIVE

## 2015-08-10 LAB — URINE MICROSCOPIC-ADD ON

## 2015-08-10 LAB — I-STAT TROPONIN, ED: Troponin i, poc: 0.01 ng/mL (ref 0.00–0.08)

## 2015-08-10 LAB — CK: CK TOTAL: 120 U/L (ref 49–397)

## 2015-08-10 LAB — PHOSPHORUS: Phosphorus: 2.3 mg/dL — ABNORMAL LOW (ref 2.5–4.6)

## 2015-08-10 LAB — TSH: TSH: 0.533 u[IU]/mL (ref 0.350–4.500)

## 2015-08-10 LAB — CBG MONITORING, ED: Glucose-Capillary: 135 mg/dL — ABNORMAL HIGH (ref 65–99)

## 2015-08-10 LAB — D-DIMER, QUANTITATIVE (NOT AT ARMC): D DIMER QUANT: 1.29 ug{FEU}/mL — AB (ref 0.00–0.48)

## 2015-08-10 LAB — I-STAT CG4 LACTIC ACID, ED: LACTIC ACID, VENOUS: 2.73 mmol/L — AB (ref 0.5–2.0)

## 2015-08-10 LAB — ABO/RH: ABO/RH(D): O POS

## 2015-08-10 LAB — MAGNESIUM: MAGNESIUM: 2.1 mg/dL (ref 1.7–2.4)

## 2015-08-10 LAB — POC OCCULT BLOOD, ED: Fecal Occult Bld: NEGATIVE

## 2015-08-10 MED ORDER — PANTOPRAZOLE SODIUM 40 MG PO TBEC
40.0000 mg | DELAYED_RELEASE_TABLET | Freq: Every day | ORAL | Status: DC
  Administered 2015-08-11: 40 mg via ORAL
  Filled 2015-08-10: qty 1

## 2015-08-10 MED ORDER — ACETAMINOPHEN 325 MG PO TABS: 650.0000 mg | ORAL_TABLET | Freq: Four times a day (QID) | ORAL | Status: DC | PRN

## 2015-08-10 MED ORDER — TECHNETIUM TO 99M ALBUMIN AGGREGATED
4.6000 | Freq: Once | INTRAVENOUS | Status: AC | PRN
  Administered 2015-08-10: 4.6 via INTRAVENOUS

## 2015-08-10 MED ORDER — ONDANSETRON HCL 4 MG/2ML IJ SOLN
4.0000 mg | Freq: Once | INTRAMUSCULAR | Status: AC
  Administered 2015-08-10: 4 mg via INTRAVENOUS
  Filled 2015-08-10: qty 2

## 2015-08-10 MED ORDER — TECHNETIUM TC 99M DIETHYLENETRIAME-PENTAACETIC ACID: 36.2000 | Freq: Once | INTRAVENOUS | Status: DC | PRN

## 2015-08-10 MED ORDER — SORBITOL 70 % SOLN: 30.0000 mL | Freq: Every day | Status: DC | PRN

## 2015-08-10 MED ORDER — HEPARIN SODIUM (PORCINE) 5000 UNIT/ML IJ SOLN
5000.0000 [IU] | Freq: Three times a day (TID) | INTRAMUSCULAR | Status: DC
Start: 2015-08-10 — End: 2015-08-11
  Administered 2015-08-10 – 2015-08-11 (×2): 5000 [IU] via SUBCUTANEOUS
  Filled 2015-08-10 (×2): qty 1

## 2015-08-10 MED ORDER — ACETAMINOPHEN 650 MG RE SUPP: 650.0000 mg | Freq: Four times a day (QID) | RECTAL | Status: DC | PRN

## 2015-08-10 MED ORDER — BUSPIRONE HCL 15 MG PO TABS
7.5000 mg | ORAL_TABLET | Freq: Two times a day (BID) | ORAL | Status: DC
  Administered 2015-08-10 – 2015-08-12 (×4): 7.5 mg via ORAL
  Filled 2015-08-10 (×4): qty 1

## 2015-08-10 MED ORDER — BENZTROPINE MESYLATE 1 MG PO TABS
1.0000 mg | ORAL_TABLET | Freq: Every day | ORAL | Status: DC
  Administered 2015-08-10 – 2015-08-12 (×3): 1 mg via ORAL
  Filled 2015-08-10 (×3): qty 1

## 2015-08-10 MED ORDER — IOHEXOL 300 MG/ML  SOLN
25.0000 mL | Freq: Once | INTRAMUSCULAR | Status: AC | PRN
  Administered 2015-08-10: 25 mL via ORAL

## 2015-08-10 MED ORDER — SODIUM CHLORIDE 0.9 % IV BOLUS (SEPSIS)
1000.0000 mL | Freq: Once | INTRAVENOUS | Status: AC
Start: 2015-08-10 — End: 2015-08-10
  Administered 2015-08-10: 1000 mL via INTRAVENOUS

## 2015-08-10 MED ORDER — IPRATROPIUM BROMIDE 0.02 % IN SOLN: 0.5000 mg | RESPIRATORY_TRACT | Status: DC | PRN

## 2015-08-10 MED ORDER — INFLUENZA VAC SPLIT QUAD 0.5 ML IM SUSY
0.5000 mL | PREFILLED_SYRINGE | INTRAMUSCULAR | Status: AC
  Administered 2015-08-12: 0.5 mL via INTRAMUSCULAR
  Filled 2015-08-10: qty 0.5

## 2015-08-10 MED ORDER — ONDANSETRON HCL 4 MG PO TABS: 4.0000 mg | ORAL_TABLET | Freq: Four times a day (QID) | ORAL | Status: DC | PRN

## 2015-08-10 MED ORDER — SENNOSIDES-DOCUSATE SODIUM 8.6-50 MG PO TABS: 1.0000 | ORAL_TABLET | Freq: Every evening | ORAL | Status: DC | PRN

## 2015-08-10 MED ORDER — PROMETHAZINE HCL 25 MG/ML IJ SOLN
25.0000 mg | Freq: Once | INTRAMUSCULAR | Status: AC
  Administered 2015-08-10: 25 mg via INTRAVENOUS
  Filled 2015-08-10: qty 1

## 2015-08-10 MED ORDER — CYCLOBENZAPRINE HCL 10 MG PO TABS
10.0000 mg | ORAL_TABLET | Freq: Every day | ORAL | Status: DC
  Administered 2015-08-10 – 2015-08-11 (×2): 10 mg via ORAL
  Filled 2015-08-10 (×2): qty 1

## 2015-08-10 MED ORDER — SODIUM CHLORIDE 0.9 % IV SOLN
INTRAVENOUS | Status: DC
  Administered 2015-08-10: 125 mL/h via INTRAVENOUS
  Administered 2015-08-11: 06:00:00 via INTRAVENOUS
  Administered 2015-08-11: 75 mL/h via INTRAVENOUS
  Administered 2015-08-12: 11:00:00 via INTRAVENOUS

## 2015-08-10 MED ORDER — ONDANSETRON HCL 4 MG/2ML IJ SOLN
4.0000 mg | Freq: Four times a day (QID) | INTRAMUSCULAR | Status: DC | PRN
  Administered 2015-08-10: 4 mg via INTRAVENOUS
  Filled 2015-08-10: qty 2

## 2015-08-10 MED ORDER — LACTATED RINGERS IV BOLUS (SEPSIS)
1000.0000 mL | Freq: Once | INTRAVENOUS | Status: AC
  Administered 2015-08-10: 1000 mL via INTRAVENOUS

## 2015-08-10 MED ORDER — SODIUM CHLORIDE 0.9 % IV SOLN
80.0000 mg | Freq: Once | INTRAVENOUS | Status: AC
  Administered 2015-08-10: 80 mg via INTRAVENOUS
  Filled 2015-08-10: qty 80

## 2015-08-10 MED ORDER — ALBUTEROL SULFATE (2.5 MG/3ML) 0.083% IN NEBU: 2.5000 mg | INHALATION_SOLUTION | RESPIRATORY_TRACT | Status: DC | PRN

## 2015-08-10 MED ORDER — OXYCODONE HCL 5 MG PO TABS
5.0000 mg | ORAL_TABLET | ORAL | Status: DC | PRN
Start: 2015-08-10 — End: 2015-08-11
  Filled 2015-08-10: qty 1

## 2015-08-10 MED ORDER — SODIUM CHLORIDE 0.9 % IJ SOLN
3.0000 mL | Freq: Two times a day (BID) | INTRAMUSCULAR | Status: DC
  Administered 2015-08-11 – 2015-08-12 (×2): 3 mL via INTRAVENOUS

## 2015-08-10 NOTE — ED Notes (Signed)
Saa, Phlebotomy in room with patient at this time; patient resting watching television, no needs at this time

## 2015-08-10 NOTE — ED Notes (Signed)
Admitting at bedside 

## 2015-08-10 NOTE — ED Notes (Signed)
Patient taken to [place] from CT, remains in radiology at this time.

## 2015-08-10 NOTE — ED Notes (Signed)
Phlebotomy in room to obtain stool cultures.

## 2015-08-10 NOTE — H&P (Signed)
[facility] History and Physical  Cordelle Dahmen ZOX:[id number] DOB: 1976-08-05 DOA: 08/10/2015  Referring physician: Dr. Silverio Lay PCP: Dorrene German, MD   Chief Complaint: Blackout  HPI: Russell Quinney is a 39 y.o. male  With history of hypertension, gastroesophageal reflux disease, schizophrenia, anxiety disorder presents to the ED with syncopal episode. Patient states 1 day prior to admission was mowing grass most of the day and as he was getting done and raking in the leaves he notices started to get very dizzy with some lightheadedness which was ongoing throughout the day. Patient subsequently went to her friends have weighed noticed that he blacked out and fell. Patient stated that he noticed that he was shaking all over and felt he may have had a seizure. Patient stated that he had about 2-3 of these spells. Patient denies any tongue biting. No urinary incontinence. No bowel incontinence. Patient stated that he was nauseous and had emesis throughout the day. Patient subsequently went back home continue to have some further emesis and thought he may have noticed some dark blood in his stools. Patient called EMS and was brought to the ED. Patient reports some hiccups and some shortness of breath when he was vomiting with some associated midsternal burning sensation. Patient reports some generalized weakness. Patient denies any fevers, no chills, no chest pain, no abdominal pain, no diarrhea, no constipation, no dysuria, no cough, no hematochezia, no melana. Patient was seen in the emergency room, basic metabolic profile obtained on a chloride of 97 creatinine of 3.79 glucose of 125 otherwise was within normal limits. Hepatic panel was unremarkable. Point-of-care troponin was negative. Total CK was 120. Lactic acid level was 2.73. CBC had a white count of 20 hemoglobin of 17.5 otherwise was within normal limits. D-dimer was elevated at 1.29. Urinalysis was nitrite negative leukocytes negative  specific gravity greater than 1.030 WBCs 3-6. CT abdomen and pelvis was unremarkable. Chest x-ray was negative. EKG with right axis deviation. S1 q 3 T3. [facility] were called to admit the patient for further evaluation and management.    Review of Systems: As per history of present illness otherwise negative. Constitutional:  No weight loss, night sweats, Fevers, chills, fatigue.  HEENT:  No headaches, Difficulty swallowing,Tooth/dental problems,Sore throat,  No sneezing, itching, ear ache, nasal congestion, post nasal drip,  Cardio-vascular:  No chest pain, Orthopnea, PND, swelling in lower extremities, anasarca, dizziness, palpitations  GI:  No heartburn, indigestion, abdominal pain, nausea, vomiting, diarrhea, change in bowel habits, loss of appetite  Resp:  No shortness of breath with exertion or at rest. No excess mucus, no productive cough, No non-productive cough, No coughing up of blood.No change in color of mucus.No wheezing.No chest wall deformity  Skin:  no rash or lesions.  GU:  no dysuria, change in color of urine, no urgency or frequency. No flank pain.  Musculoskeletal:  No joint pain or swelling. No decreased range of motion. No back pain.  Psych:  No change in mood or affect. No depression or anxiety. No memory loss.   Past Medical History  Diagnosis Date  . Hypertension   . Stomach ulcer   . GERD (gastroesophageal reflux disease)   . Schizophrenia, paranoid   . HTN (hypertension) 08/10/2015  . Schizophrenia, schizo-affective type 08/10/2015  . Anxiety 08/10/2015   History reviewed. No pertinent past surgical history. Social History:  reports that he has been smoking Cigarettes.  He has been smoking about 1.00 pack per day. He does not have any smokeless tobacco  history on file. He reports that he does not drink alcohol or use illicit drugs.  Allergies  Allergen Reactions  . Bee Venom Swelling    Family History  Problem Relation Age of Onset  .  GER disease Mother   . Other Father    patient states he is currently disabled and occasionally cuts grass. Mother alive with history of gastroesophageal reflux disease. Father deceased age 99 from motor vehicle accident.  Prior to Admission medications   Medication Sig Start Date End Date Taking? Authorizing Provider  acetaminophen (TYLENOL) 500 MG tablet Take 1,000 mg by mouth every 4 (four) hours as needed for mild pain.   Yes Historical Provider, MD  BENZTROPINE MESYLATE PO Take 1 tablet by mouth daily.   Yes Historical Provider, MD  busPIRone (BUSPAR) 7.5 MG tablet Take 7.5 mg by mouth 2 (two) times daily.   Yes Historical Provider, MD  cyclobenzaprine (FLEXERIL) 10 MG tablet Take 10 mg by mouth at bedtime.   Yes Historical Provider, MD  ibuprofen (ADVIL,MOTRIN) 800 MG tablet Take 1 tablet (800 mg total) by mouth 3 (three) times daily. Patient not taking: Reported on 11/25/2014 04/05/14   Gilda Crease, MD  olmesartan (BENICAR) 40 MG tablet Take 40 mg by mouth daily.   Yes Historical Provider, MD   Physical Exam: Filed Vitals:   08/10/15 0745 08/10/15 0800 08/10/15 0915 08/10/15 0930  BP: 127/58 122/62 115/78 126/84  Pulse: 102 107 99 104  Temp:      TempSrc:      Resp: Height:      Weight:      SpO2: 98% 100% 99% 97%    Wt Readings from Last 3 Encounters:  08/10/15 86.183 kg (190 lb)    General:  Well-developed well-nourished laying on gurney no acute cardiopulmonary distress. Speaking in full sentences.  Eyes: PERRLA, EOMI, normal lids, irises & conjunctiva ENT: grossly normal hearing, lips & tongue. Dry mucous membranes. Neck: no LAD, masses or thyromegaly Cardiovascular: RRR, no m/r/g. No LE edema. Telemetry: SR, no arrhythmias  Respiratory: CTA bilaterally, no w/r/r. Normal respiratory effort. Abdomen: soft, ntnd, positive bowel sounds, no rebound, no guarding. Skin: no rash or induration seen on limited exam Musculoskeletal: grossly normal tone  BUE/BLE Psychiatric: grossly normal mood and affect, speech fluent and appropriate Neurologic: Alert and oriented 3. Cranial nerves II through XII are grossly intact. No focal deficits.           Labs on Admission:  Basic Metabolic Panel:  Recent Labs Lab 08/10/15 0435  NA 140  K 4.0  CL 97*  CO2 26  GLUCOSE 125*  BUN 20  CREATININE 3.79*  CALCIUM 9.5   Liver Function Tests:  Recent Labs Lab 08/10/15 0649  AST 22  ALT 25  ALKPHOS 90  BILITOT 0.8  PROT 7.1  ALBUMIN 4.1   No results for input(s): LIPASE, AMYLASE in the last 168 hours. No results for input(s): AMMONIA in the last 168 hours. CBC:  Recent Labs Lab 08/10/15 0435  WBC 20.0*  HGB 17.5*  HCT 48.9  MCV 88.7  PLT 376   Cardiac Enzymes:  Recent Labs Lab 08/10/15 0457  CKTOTAL 120    BNP (last 3 results) No results for input(s): BNP in the last 8760 hours.  ProBNP (last 3 results) No results for input(s): PROBNP in the last 8760 hours.  CBG:  Recent Labs Lab 08/10/15 0425  GLUCAP 135*    Radiological Exams on Admission:  Ct Abdomen Pelvis Wo Contrast  08/10/2015   CLINICAL DATA:  Abdominal pain with blood in stools.  EXAM: CT ABDOMEN AND PELVIS WITHOUT CONTRAST  TECHNIQUE: Multidetector CT imaging of the abdomen and pelvis was performed following the standard protocol without IV contrast.  COMPARISON:  None currently available  FINDINGS: Lower chest and abdominal wall: Mild dependent atelectasis. No indication pneumonia.  Hepatobiliary: No focal liver abnormality.No evidence of biliary obstruction or stone.  Pancreas: Unremarkable.  Spleen: Unremarkable.  Adrenals/Urinary Tract: Negative adrenals. No hydronephrosis or stone. Unremarkable bladder.  Reproductive:No pathologic findings.  Stomach/Bowel: No obstruction or inflammatory change. No appendicitis. No diverticulosis to explain bloody stools.  Vascular/Lymphatic: No acute vascular abnormality. No mass or adenopathy.  Peritoneal: No  ascites or pneumoperitoneum.  Musculoskeletal: No acute abnormalities.  IMPRESSION: Negative.  No explanation for symptoms.   Electronically Signed   By: Marnee Spring M.D.   On: 08/10/2015 08:35   Dg Chest Port 1 View  08/10/2015   CLINICAL DATA:  Dizziness and chest pain  EXAM: PORTABLE CHEST - 1 VIEW  COMPARISON:  None.  FINDINGS: Normal heart size and mediastinal contours. No acute infiltrate or edema. No effusion or pneumothorax. No acute osseous findings.  IMPRESSION: Negative portable chest   Electronically Signed   By: Marnee Spring M.D.   On: 08/10/2015 09:59    EKG: Independently reviewed. Right axis deviation. S1, Q3, T3.  Assessment/Plan Principal Problem:   Syncope Active Problems:   ARF (acute renal failure)   Dehydration   Leukocytosis   HTN (hypertension)   Blood in the stool   Orthostasis   GERD (gastroesophageal reflux disease)   Schizophrenia, schizo-affective type   Anxiety  #1 syncope Likely secondary to orthostasis and dehydration as patient was mowing lawn all afternoon in the heat. Patient's labs are consistent with dehydration as he is hemoconcentrated. Patient with also complaints of dizziness and lightheadedness prior to his syncopal episodes. Patient concerned that he may have had seizures as he was shaking all over and stated he was also on Abilify. Doubt if patient has any seizures. Likely secondary to orthostasis. Patient was noted to be orthostatic by pulse. Will admit patient to telemetry. Check a CT of the head. Check a 2-D echo. Check a EEG. VQ scan has been ordered as patient is noted to have S1 every 3 T3 on EKG. Will hydrate patient with IV fluids. If patient improves clinically and EEG has not been done and patient is stable EEG could possibly be done in the outpatient setting.  #2 acute renal failure Likely secondary to a prerenal azotemia in the setting of ARB. Patient noted to be hemoconcentrated. Patient also noted to be dehydrated on clinical  exam. Will check a fractional excretion of sodium. Check a renal ultrasound. Hydrated with IV fluids and follow. If no improvement in renal function may need to consult with nephrology.  #3 dehydration IV fluids.  #4 leukocytosis Likely secondary to dehydration and reactive. Urinalysis is negative for UTI. Chest x-ray is negative for any acute infiltrates. Patient has had blood cultures which have been drawn. No need follow-up about its at this time. Follow.  #5 hypertension Stable. Will hold patient's ARB secondary to problem #2. A blood pressure control is needed may consider Norvasc.  #6 blood in stool Patient states noticed some blood in his stool. Patient's hemoglobin is stable. Will follow for now. Will place on a PPI.  #7 gastroesophageal reflux disease PPI.  #8 schizophrenia/anxiety Stable. Patient states he gets  monthly Abilify injections. Continue benztropine. Continue BuSpar.  #9 orthostasis IV fluids.  #10 prophylaxis PPI for GI prophylaxis. Heparin for DVT prophylaxis  Code Status: Full DVT Prophylaxis: Heparin. Family Communication: Updated patient. No family at bedside. Disposition Plan: Admit to telemetry.  Time spent: 65 minutes  THOMPSON,DANIEL M.D. [facility] Pager [phone]

## 2015-08-10 NOTE — ED Provider Notes (Signed)
  Physical Exam  BP 122/62 mmHg  Pulse 107  Temp(Src) 98.1 F (36.7 C) (Oral)  Resp 25  Ht  (1.778 m)  Wt 190 lb (86.183 kg)  BMI 27.26 kg/m2  SpO2 100%  Physical Exam  ED Course  Procedures  MDM Care assumed at sign out from Dr. Higinio Plan at 7am. Patient here with syncope then had vomiting and abdominal pain. Denies diarrhea to me. Was tachy on arrival. Has WBC 20, acute renal failure 3.7 (no baseline but has no kidney issues in the past). Sign out pending CT a/pel and admission. CT unremarkable. Still tachy in 120s despite 3 L NS. Will admit for dehydration, acute renal failure. Afebrile, cultures sent, no obvious source of leukocytosis so held abx.   Richardean Canal, MD 08/10/15 0900

## 2015-08-10 NOTE — ED Notes (Signed)
Per EMS, pt began vomiting an hour prior to arrival and pt had an unwitnessed syncopal episode. Pt states that he had a bowel movement and had blood in it. Pt states that he had formed stool. EMS BP 114/80, when pt stood up BP fell to 98/69 and HR 110 Sinus tach on 12 lead, HR to 130 after pt stood up. CBG 154. Alert and oriented x 4. Pt states he remember episode. Pt states that he has had multiple syncopal and near syncopal episodes since yesterday afternoon

## 2015-08-10 NOTE — ED Provider Notes (Signed)
CSN: [id number]     Arrival date & time 08/10/15  0402 History   First MD Initiated Contact with Patient 08/10/15 0424     Chief Complaint  Patient presents with  . Loss of Consciousness     (Consider location/radiation/quality/duration/timing/severity/associated sxs/prior Treatment) HPI Comments: Pt with hx of HTN, GERD. Pt reports that he started getting unwell late afternoon. Pt was cutting grass, he started getting dizzy, and he fainted. Pt knows that his body started shaking before he fainted. Pt reports having 3 episodes of syncope over 6 hours. His symptoms are all preceded by him getting dizzy. His episodes have been unwitnessed. He has no hx of syncope. Pt had some chest tightness prior to passing out, but no dib, no palpitations. No cardiac hx for the patient and there is no fam hx of young people with sudden unexpected deaths or dysrhythmias. No substance abuse. Pt has no hx of PE, DVT and denies any exogenous estrogen use, long distance travels or surgery in the past 6 weeks, active cancer, recent immobilization.  Pt went to bed regardless. He reports that he woke up at 2:30 am with nausea and he has had at least 5 episodes of emesis. Vomit was red. He had red tea before sleeping. Hx of stomach ulcer, and his ppi was stopped due to side effects. Stomach ulcer was diagnosed with endoscopy several years back. Pt had a BM with dark bloody stools today.  ROS 10 Systems reviewed and are negative for acute change except as noted in the HPI.   Patient is a 39 y.o. male presenting with syncope. The history is provided by the patient.  Loss of Consciousness Associated symptoms: vomiting     Past Medical History  Diagnosis Date  . Hypertension   . Stomach ulcer   . GERD (gastroesophageal reflux disease)   . Schizophrenia, paranoid    History reviewed. No pertinent past surgical history. History reviewed. No pertinent family history. Social History  Substance Use Topics  . Smoking  status: Current Every Day Smoker -- 1.00 packs/day    Types: Cigarettes  . Smokeless tobacco: None  . Alcohol Use: No    Review of Systems  Cardiovascular: Positive for syncope.  Gastrointestinal: Positive for vomiting, abdominal pain, diarrhea and blood in stool.  Neurological: Positive for syncope.  All other systems reviewed and are negative.     Allergies  Bee venom  Home Medications   Prior to Admission medications   Medication Sig Start Date End Date Taking? Authorizing Provider  acetaminophen (TYLENOL) 500 MG tablet Take 1,000 mg by mouth every 4 (four) hours as needed for mild pain.   Yes Historical Provider, MD  BENZTROPINE MESYLATE PO Take 1 tablet by mouth daily.   Yes Historical Provider, MD  busPIRone (BUSPAR) 7.5 MG tablet Take 7.5 mg by mouth 2 (two) times daily.   Yes Historical Provider, MD  cyclobenzaprine (FLEXERIL) 10 MG tablet Take 10 mg by mouth at bedtime.   Yes Historical Provider, MD  HYDROcodone-acetaminophen (NORCO/VICODIN) 5-325 MG per tablet Take 2 tablets by mouth every 4 (four) hours as needed for moderate pain. Patient not taking: Reported on 11/25/2014 04/05/14   Gilda Crease, MD  ibuprofen (ADVIL,MOTRIN) 800 MG tablet Take 1 tablet (800 mg total) by mouth 3 (three) times daily. Patient not taking: Reported on 11/25/2014 04/05/14   Gilda Crease, MD  metoCLOPramide (REGLAN) 10 MG tablet Take 1 tablet (10 mg total) by mouth every 8 (eight) hours as needed (headache).  11/25/14   Tomasita Crumble, MD  olmesartan (BENICAR) 40 MG tablet Take 40 mg by mouth daily.   Yes Historical Provider, MD   BP 117/69 mmHg  Pulse 110  Temp(Src) 98.1 F (36.7 C) (Oral)  Resp 18  Ht 5\' 10"  (1.778 m)  Wt 190 lb (86.183 kg)  BMI 27.26 kg/m2  SpO2 97% Physical Exam  Constitutional: He is oriented to person, place, and time. He appears well-developed.  HENT:  Head: Normocephalic and atraumatic.  Eyes: Conjunctivae and EOM are normal. Pupils are equal,  round, and reactive to light.  Neck: Normal range of motion. Neck supple.  Cardiovascular: Normal rate, regular rhythm and intact distal pulses.   Pulmonary/Chest: Effort normal and breath sounds normal.  Abdominal: Soft. Bowel sounds are normal. He exhibits no distension. There is tenderness. There is no rebound and no guarding.  Epigastric and upper quadrant abdominal tenderness  Neurological: He is alert and oriented to person, place, and time. No cranial nerve deficit. Coordination normal.  Skin: Skin is warm.  Nursing note and vitals reviewed.   ED Course  Procedures (including critical care time) Labs Review Labs Reviewed  BASIC METABOLIC PANEL - Abnormal; Notable for the following:    Chloride 97 (*)    Glucose, Bld 125 (*)    Creatinine, Ser 3.79 (*)    GFR calc non Af Amer 19 (*)    GFR calc Af Amer 22 (*)    Anion gap 17 (*)    All other components within normal limits  CBC - Abnormal; Notable for the following:    WBC 20.0 (*)    Hemoglobin 17.5 (*)    All other components within normal limits  URINALYSIS, ROUTINE W REFLEX MICROSCOPIC (NOT AT [facility]) - Abnormal; Notable for the following:    APPearance CLOUDY (*)    Specific Gravity, Urine >1.030 (*)    Hgb urine dipstick MODERATE (*)    Bilirubin Urine SMALL (*)    Protein, ur >300 (*)    All other components within normal limits  URINE MICROSCOPIC-ADD ON - Abnormal; Notable for the following:    Bacteria, UA FEW (*)    Casts HYALINE CASTS (*)    All other components within normal limits  CBG MONITORING, ED - Abnormal; Notable for the following:    Glucose-Capillary 135 (*)    All other components within normal limits  I-STAT CG4 LACTIC ACID, ED - Abnormal; Notable for the following:    Lactic Acid, Venous 2.73 (*)    All other components within normal limits  CULTURE, BLOOD (ROUTINE X 2)  CULTURE, BLOOD (ROUTINE X 2)  C DIFFICILE QUICK SCREEN W PCR REFLEX  STOOL CULTURE  HEPATIC FUNCTION PANEL  CK  POC  OCCULT BLOOD, ED  I-STAT TROPOININ, ED  TYPE AND SCREEN  ABO/RH    Imaging Review No results found. I have personally reviewed and evaluated these images and lab results as part of my medical decision-making.   EKG Interpretation   Date/Time:  Saturday August 10 2015 04:11:25 EDT Ventricular Rate:  115 PR Interval:  124 QRS Duration: 80 QT Interval:  356 QTC Calculation: 492 R Axis:   95 Text Interpretation:  Sinus tachycardia Borderline right axis deviation  Borderline repolarization abnormality Borderline prolonged QT interval  s1q3t3 pattern noticed Confirmed by Rhunette Croft, MD, Haywood Meinders [phone]) on  08/10/2015 4:24:45 AM      MDM   Final diagnoses:  AKI (acute kidney injury)  Syncope and collapse  Nausea and vomiting  in adult    DDx includes: Pancreatitis Hepatobiliary pathology including cholecystitis Gastritis/PUD SBO ACS syndrome Aortic Dissection PE GI bleed Symptomatic anemia  Pt comes in with cc of emesis and diarrhea. He had red emesis. Hx of PUD. Will give protonix 80 mg. Hb is 17.8 - hemoconcerntrated, likely from dehydration. guaic neg stools. Will not give protonix drip. Will need serial CBC and symptom control. Pt has abd discomfort, epigastric, neg Murphy's. With WC elevation CT abdomen ordered. We want to ensure there is no pancreatitis or signs of cholecystitis. Clinically biliary process is low on the ddx - LFTs added to the initial workup and pending. Cdiff study ordered too due to loose BM and elevated WC, pretest probability is low. No antibiotics for now. Fluid started.  Syncope DDx includes: Orthostatic hypotension Vertebral artery dissection/stenosis Dysrhythmia PE Vasovagal/neurocardiogenic syncope Anemia  Pt reports 3 episodes of syncope. No neuro complains or focal neuro findings. No PE risk factors - although, he has s1q3t3 with the tachycardia on ekg. He reports no syncope in the past or such events prior to yday. His sx sound  vasovagal and orthostatic currently - as they occur with him getting up and he has a prodrome. Also is clearly dehydrated right now.  Dr Silverio Lay taking over the case. CK, LFTs pending. CT abd pending. Pt will need admission. PE workup with dimer might be warranted if pt continues to have dizziness - will defer that aspect of care for admitting team.      Derwood Kaplan, MD 08/10/15 [phone]

## 2015-08-11 DIAGNOSIS — R55 Syncope and collapse: Secondary | ICD-10-CM

## 2015-08-11 DIAGNOSIS — N179 Acute kidney failure, unspecified: Principal | ICD-10-CM

## 2015-08-11 DIAGNOSIS — K210 Gastro-esophageal reflux disease with esophagitis: Secondary | ICD-10-CM

## 2015-08-11 LAB — CBC
HCT: 39.3 % (ref 39.0–52.0)
Hemoglobin: 12.9 g/dL — ABNORMAL LOW (ref 13.0–17.0)
MCH: 30.5 pg (ref 26.0–34.0)
MCHC: 32.8 g/dL (ref 30.0–36.0)
MCV: 92.9 fL (ref 78.0–100.0)
PLATELETS: 238 10*3/uL (ref 150–400)
RBC: 4.23 MIL/uL (ref 4.22–5.81)
RDW: 13.4 % (ref 11.5–15.5)
WBC: 9.7 10*3/uL (ref 4.0–10.5)

## 2015-08-11 LAB — COMPREHENSIVE METABOLIC PANEL
ALBUMIN: 2.9 g/dL — AB (ref 3.5–5.0)
ALT: 18 U/L (ref 17–63)
AST: 18 U/L (ref 15–41)
Alkaline Phosphatase: 64 U/L (ref 38–126)
Anion gap: 5 (ref 5–15)
BUN: 8 mg/dL (ref 6–20)
CHLORIDE: 110 mmol/L (ref 101–111)
CO2: 26 mmol/L (ref 22–32)
CREATININE: 1.37 mg/dL — AB (ref 0.61–1.24)
Calcium: 8.4 mg/dL — ABNORMAL LOW (ref 8.9–10.3)
GFR calc Af Amer: >60 mL/min (ref >60)
GFR calc non Af Amer: >60 mL/min (ref >60)
Glucose, Bld: 91 mg/dL (ref 65–99)
Potassium: 4.4 mmol/L (ref 3.5–5.1)
SODIUM: 141 mmol/L (ref 135–145)
Total Bilirubin: 0.3 mg/dL (ref 0.3–1.2)
Total Protein: 5.4 g/dL — ABNORMAL LOW (ref 6.5–8.1)

## 2015-08-11 LAB — SODIUM, URINE, RANDOM: SODIUM UR: 67 mmol/L

## 2015-08-11 LAB — LACTIC ACID, PLASMA: Lactic Acid, Venous: 1.3 mmol/L (ref 0.5–2.0)

## 2015-08-11 LAB — CREATININE, URINE, RANDOM: CREATININE, URINE: 98.96 mg/dL

## 2015-08-11 MED ORDER — PANTOPRAZOLE SODIUM 40 MG PO TBEC
40.0000 mg | DELAYED_RELEASE_TABLET | Freq: Two times a day (BID) | ORAL | Status: DC
  Administered 2015-08-11 – 2015-08-12 (×2): 40 mg via ORAL
  Filled 2015-08-11 (×2): qty 1

## 2015-08-11 MED ORDER — ENOXAPARIN SODIUM 100 MG/ML ~~LOC~~ SOLN
85.0000 mg | Freq: Two times a day (BID) | SUBCUTANEOUS | Status: DC
  Administered 2015-08-11 – 2015-08-12 (×3): 85 mg via SUBCUTANEOUS
  Filled 2015-08-11 (×3): qty 1

## 2015-08-11 MED ORDER — GI COCKTAIL ~~LOC~~: 30.0000 mL | Freq: Three times a day (TID) | ORAL | Status: DC | PRN

## 2015-08-11 NOTE — Evaluation (Signed)
Physical Therapy Evaluation Patient Details Name: Cameron Oliver MRN: [id number] DOB: 1976-11-09 Today's Date: 08/11/2015   History of Present Illness  Patient is a 39 yo male admitted 08/10/15 with syncope, dehydration, ARI.  PMH:  HTN, anxiety, schizophrenia  Clinical Impression  Patient is independent with all mobility and gait.  Patient reports no dizziness during entire PT session.  Good balance in stance and with gait.  No acute PT needs identified - PT will sign off.    Follow Up Recommendations No PT follow up;Supervision - Intermittent    Equipment Recommendations  None recommended by PT    Recommendations for Other Services       Precautions / Restrictions Precautions Precautions: None Restrictions Weight Bearing Restrictions: No      Mobility  Bed Mobility Overal bed mobility: Independent                Transfers Overall transfer level: Independent Equipment used: None                Ambulation/Gait Ambulation/Gait assistance: Independent Ambulation Distance (Feet): 200 Feet Assistive device: None Gait Pattern/deviations: Step-through pattern   Gait velocity interpretation: at or above normal speed for age/gender General Gait Details: Patient demonstrates good gait pattern, balance, and speed.  No assist needed.  Stairs            Wheelchair Mobility    Modified Rankin (Stroke Patients Only)       Balance                 Single Leg Stance - Right Leg: 15 Single Leg Stance - Left Leg: 15     Rhomberg - Eyes Opened: 30 Rhomberg - Eyes Closed: 30 High level balance activites: Direction changes;Turns;Sudden stops;Head turns High Level Balance Comments: Good balance with all high level balance activities except slight imbalance with head turns horizontally.  No loss of balance.             Pertinent Vitals/Pain Pain Assessment: No/denies pain    Home Living Family/patient expects to be discharged to:: Private  residence Living Arrangements: Alone Available Help at Discharge: Family;Available PRN/intermittently Type of Home: Apartment Home Access: Stairs to enter Entrance Stairs-Rails: Right;Left Entrance Stairs-Number of Steps: flight Home Layout: One level Home Equipment: None      Prior Function Level of Independence: Independent         Comments: Does not drive.  Was doing yardwork day pta.     Hand Dominance        Extremity/Trunk Assessment   Upper Extremity Assessment: Overall WFL for tasks assessed           Lower Extremity Assessment: Overall WFL for tasks assessed         Communication   Communication: No difficulties  Cognition Arousal/Alertness: Awake/alert Behavior During Therapy: WFL for tasks assessed/performed Overall Cognitive Status: Within Functional Limits for tasks assessed                      General Comments      Exercises        Assessment/Plan    PT Assessment Patent does not need any further PT services  PT Diagnosis Abnormality of gait   PT Problem List    PT Treatment Interventions     PT Goals (Current goals can be found in the Care Plan section) Acute Rehab PT Goals PT Goal Formulation: All assessment and education complete, DC therapy    Frequency  Barriers to discharge        Co-evaluation               End of Session   Activity Tolerance: Patient tolerated treatment well Patient left: in bed;with call bell/phone within reach           Time: 4098-1191 PT Time Calculation (min) (ACUTE ONLY): 11 min   Charges:   PT Evaluation $Initial PT Evaluation Tier I: 1 Procedure     PT G CodesVena Austria 29-Aug-2015, 1:36 PM Durenda Hurt. Renaldo Fiddler, [facility] Acute Rehab Services Pager [phone]

## 2015-08-11 NOTE — Progress Notes (Signed)
ANTICOAGULATION CONSULT NOTE - Initial Consult  Pharmacy Consult for Lovenox Indication: rule out PE  Allergies  Allergen Reactions  . Bee Venom Swelling    Patient Measurements: Height:  (177.8 cm) Weight: 190 lb 3.2 oz (86.274 kg) IBW/kg (Calculated) : 73  Vital Signs: Temp: 98.5 F (36.9 C) (09/11 1045) Temp Source: Oral (09/11 1045) BP: 134/81 mmHg (09/11 1045) Pulse Rate: 89 (09/11 1045)  Labs:  Recent Labs  08/10/15 0435 08/10/15 0457 08/10/15 1910 08/11/15 0620  HGB 17.5*  --  14.5 12.9*  HCT 48.9  --  42.6 39.3  PLT 376  --  284 238  CREATININE 3.79*  --  1.79* 1.37*  CKTOTAL  --  120  --   --     Estimated Creatinine Clearance: 76.2 mL/min (by C-G formula based on Cr of 1.37).   Medical History: Past Medical History  Diagnosis Date  . Hypertension   . Stomach ulcer   . GERD (gastroesophageal reflux disease)   . Schizophrenia, paranoid   . HTN (hypertension) 08/10/2015  . Schizophrenia, schizo-affective type 08/10/2015  . Anxiety 08/10/2015    Assessment: 39 year old male admitted with syncope found to have intermediate possibility for PE on VQ scan.  Pharmacy asked to dose Lovenox until CTA performed.  Scr = 1.37 (improved from admission)  Goal of Therapy:  Anti-Xa level 0.6-1 units/ml 4hrs after LMWH dose given Monitor platelets by anticoagulation protocol: Yes   Plan:  Lovenox 85 mg sq q 12 hours Follow up diagnosis, CBC, Scr  Thank you Okey Regal, PharmD [phone]  Elwin Sleight 08/11/2015,11:07 AM

## 2015-08-11 NOTE — Progress Notes (Signed)
[facility] PROGRESS NOTE  Cameron Oliver ZOX:[id number] DOB: [birth year] DOA: 08/10/2015 PCP: Dorrene German, MD  Assessment/Plan: 1. Syncope -likely due to Orthostatic hypotension/dehydration -BP and HR improved with hydration -FU ECHO, no events on tele -also with elevated D-dimer, intermediate VQ scan PE is also possibility however no pleuritic chest pain, dyspnea, hypoxia or tachycardia  2.Intermediate VQ scan  -PE is a possibility however no pleuritic chest pain, dyspnea, hypoxia or tachycardia -will check duplex lower ext/and hopefully CTA tomorrow if creatinine improved -Empiric full dose lovenox till tomorrow pending CTA  3. Aki -due to dehydration, volume depletion, ARB and NSAIDs -improving, cut down IVF  4. GERD/heart burn -increase protonix to BID, add Gi cocktail PRN  5. Schizophrenia/anxiety d/o -stable, continue home meds  6. H/o BRBPR -resolved, Hb WNL, was hemo concentrated which resolved with IVF, dilution -FU with GI if recurs -PPI  DVT proph: Lovenox full dose now  Code Status: Full Code Family Communication: none at bedside Disposition Plan: home tomorrow pending workup  HPI/Subjective: Feels well, c/o heartburn, no pleuritic chest pain, no dyspnea  Objective: Filed Vitals:   08/11/15 0430  BP: 118/68  Pulse: 75  Temp: 98.3 F (36.8 C)  Resp: 18    Intake/Output Summary (Last 24 hours) at 08/11/15 1021 Last data filed at 08/11/15 0557  Gross per 24 hour  Intake 2928.75 ml  Output    400 ml  Net 2528.75 ml   Filed Weights   08/10/15 0408 08/10/15 2055  Weight: 86.183 kg (190 lb) 86.274 kg (190 lb 3.2 oz)    Exam:   General:  AAOx3  Cardiovascular: S1S2/RRR  Respiratory: CTAB  Abdomen: soft, NT, BS present  Musculoskeletal: no edema c/c   Data Reviewed: Basic Metabolic Panel:  Recent Labs Lab 08/10/15 0435 08/10/15 1910 08/11/15 0620  NA 140 139 141  K 4.0 3.9 4.4  CL 97* 106 110  CO2 26 26 26   GLUCOSE  125* 95 91  BUN 20 12 8   CREATININE 3.79* 1.79* 1.37*  CALCIUM 9.5 8.2* 8.4*  MG  --  2.1  --   PHOS  --  2.3*  --    Liver Function Tests:  Recent Labs Lab 08/10/15 0649 08/11/15 0620  AST 22 18  ALT 25 18  ALKPHOS 90 64  BILITOT 0.8 0.3  PROT 7.1 5.4*  ALBUMIN 4.1 2.9*   No results for input(s): LIPASE, AMYLASE in the last 168 hours. No results for input(s): AMMONIA in the last 168 hours. CBC:  Recent Labs Lab 08/10/15 0435 08/10/15 1910 08/11/15 0620  WBC 20.0* 13.7* 9.7  HGB 17.5* 14.5 12.9*  HCT 48.9 42.6 39.3  MCV 88.7 92.0 92.9  PLT 376 284 238   Cardiac Enzymes:  Recent Labs Lab 08/10/15 0457  CKTOTAL 120   BNP (last 3 results) No results for input(s): BNP in the last 8760 hours.  ProBNP (last 3 results) No results for input(s): PROBNP in the last 8760 hours.  CBG:  Recent Labs Lab 08/10/15 0425  GLUCAP 135*    No results found for this or any previous visit (from the past 240 hour(s)).   Studies: Ct Abdomen Pelvis Wo Contrast  08/10/2015   CLINICAL DATA:  Abdominal pain with blood in stools.  EXAM: CT ABDOMEN AND PELVIS WITHOUT CONTRAST  TECHNIQUE: Multidetector CT imaging of the abdomen and pelvis was performed following the standard protocol without IV contrast.  COMPARISON:  None currently available  FINDINGS: Lower chest and abdominal wall: Mild dependent  atelectasis. No indication pneumonia.  Hepatobiliary: No focal liver abnormality.No evidence of biliary obstruction or stone.  Pancreas: Unremarkable.  Spleen: Unremarkable.  Adrenals/Urinary Tract: Negative adrenals. No hydronephrosis or stone. Unremarkable bladder.  Reproductive:No pathologic findings.  Stomach/Bowel: No obstruction or inflammatory change. No appendicitis. No diverticulosis to explain bloody stools.  Vascular/Lymphatic: No acute vascular abnormality. No mass or adenopathy.  Peritoneal: No ascites or pneumoperitoneum.  Musculoskeletal: No acute abnormalities.  IMPRESSION:  Negative.  No explanation for symptoms.   Electronically Signed   By: Marnee Spring M.D.   On: 08/10/2015 08:35   Ct Head Wo Contrast  08/10/2015   CLINICAL DATA:  Seizure, syncope  EXAM: CT HEAD WITHOUT CONTRAST  TECHNIQUE: Contiguous axial images were obtained from the base of the skull through the vertex without intravenous contrast.  COMPARISON:  None.  FINDINGS: No acute hemorrhage, infarct, or mass lesion is identified. No midline shift. Ventricles are normal in size. Orbits and are unremarkable. Mild pansinusitis. No skull fracture.  IMPRESSION: No acute intracranial abnormality.  Mild sinusitis.   Electronically Signed   By: Christiana Pellant M.D.   On: 08/10/2015 11:39   US Renal  08/10/2015   CLINICAL DATA:  Acute renal failure  EXAM: RENAL / URINARY TRACT ULTRASOUND COMPLETE  COMPARISON:  08/10/2015  FINDINGS: Right Kidney:  Length: 10.2 cm. Echogenicity within normal limits. No mass or hydronephrosis visualized.  Left Kidney:  Length: 11.4 cm. Echogenicity within normal limits. No mass or hydronephrosis visualized.  Bladder:  Appears normal for degree of bladder distention.  IMPRESSION: 1. Normal renal sonogram.   Electronically Signed   By: Signa Kell M.D.   On: 08/10/2015 11:49   Nm Pulmonary Perf And Vent  08/10/2015   CLINICAL DATA:  Shortness of breath  EXAM: NUCLEAR MEDICINE VENTILATION - PERFUSION LUNG SCAN  TECHNIQUE: Ventilation images were obtained in multiple projections using inhaled aerosol Tc-30m DTPA. Perfusion images were obtained in multiple projections after intravenous injection of Tc-55m MAA.  RADIOPHARMACEUTICALS:  36.2 mCi Technetium-63m DTPA aerosol inhalation and 4.6 mCi Technetium-60m MAA IV  COMPARISON:  Chest radiograph same date  FINDINGS: Ventilation: Inhomogeneous ventilation with obscuration of detail over the central lungs due to large amount of clumping of radiotracer.  Perfusion: No wedge shaped peripheral perfusion defects to suggest acute pulmonary  embolism. Moderately inhomogeneous per fusion without wedge-shaped segmental defect.  IMPRESSION: Inhomogeneous ventilation and perfusion images without matched defects. Intermediate probability for pulmonary embolism.   Electronically Signed   By: Christiana Pellant M.D.   On: 08/10/2015 14:26   Dg Chest Port 1 View  08/10/2015   CLINICAL DATA:  Dizziness and chest pain  EXAM: PORTABLE CHEST - 1 VIEW  COMPARISON:  None.  FINDINGS: Normal heart size and mediastinal contours. No acute infiltrate or edema. No effusion or pneumothorax. No acute osseous findings.  IMPRESSION: Negative portable chest   Electronically Signed   By: Marnee Spring M.D.   On: 08/10/2015 09:59    Scheduled Meds: . benztropine  1 mg Oral Daily  . busPIRone  7.5 mg Oral BID  . cyclobenzaprine  10 mg Oral QHS  . heparin  5,000 Units Subcutaneous 3 times per day  . Influenza vac split quadrivalent PF  0.5 mL Intramuscular Tomorrow-1000  . pantoprazole  40 mg Oral BID  . sodium chloride  3 mL Intravenous Q12H   Continuous Infusions: . sodium chloride 125 mL/hr at 08/11/15 0545   Antibiotics Given (last 72 hours)    None  Principal Problem:   Syncope Active Problems:   ARF (acute renal failure)   Dehydration   Leukocytosis   HTN (hypertension)   Blood in the stool   Orthostasis   GERD (gastroesophageal reflux disease)   Schizophrenia, schizo-affective type   Anxiety    Time spent:    [facility]  [facility] Pager [phone]. If 7PM-7AM, please contact night-coverage at [email or web address], password [facility] 08/11/2015, 10:21 AM  LOS: 1 day

## 2015-08-12 ENCOUNTER — Inpatient Hospital Stay (HOSPITAL_COMMUNITY): Payer: Medicare Other

## 2015-08-12 DIAGNOSIS — R55 Syncope and collapse: Secondary | ICD-10-CM

## 2015-08-12 DIAGNOSIS — N179 Acute kidney failure, unspecified: Secondary | ICD-10-CM | POA: Insufficient documentation

## 2015-08-12 LAB — CBC
HEMATOCRIT: 35.7 % — AB (ref 39.0–52.0)
HEMOGLOBIN: 12.1 g/dL — AB (ref 13.0–17.0)
MCH: 31.1 pg (ref 26.0–34.0)
MCHC: 33.9 g/dL (ref 30.0–36.0)
MCV: 91.8 fL (ref 78.0–100.0)
Platelets: 213 10*3/uL (ref 150–400)
RBC: 3.89 MIL/uL — ABNORMAL LOW (ref 4.22–5.81)
RDW: 13.1 % (ref 11.5–15.5)
WBC: 8.7 10*3/uL (ref 4.0–10.5)

## 2015-08-12 LAB — BASIC METABOLIC PANEL
ANION GAP: 5 (ref 5–15)
BUN: 6 mg/dL (ref 6–20)
CHLORIDE: 108 mmol/L (ref 101–111)
CO2: 26 mmol/L (ref 22–32)
Calcium: 8.5 mg/dL — ABNORMAL LOW (ref 8.9–10.3)
Creatinine, Ser: 1.21 mg/dL (ref 0.61–1.24)
GFR calc non Af Amer: >60 mL/min (ref >60)
GLUCOSE: 87 mg/dL (ref 65–99)
POTASSIUM: 4.1 mmol/L (ref 3.5–5.1)
Sodium: 139 mmol/L (ref 135–145)

## 2015-08-12 MED ORDER — AMLODIPINE BESYLATE 5 MG PO TABS: 5.0000 mg | ORAL_TABLET | Freq: Every day | ORAL | Status: AC

## 2015-08-12 MED ORDER — IOHEXOL 350 MG/ML SOLN
100.0000 mL | Freq: Once | INTRAVENOUS | Status: AC | PRN
  Administered 2015-08-12: 100 mL via INTRAVENOUS

## 2015-08-12 MED ORDER — PANTOPRAZOLE SODIUM 40 MG PO TBEC: 40.0000 mg | DELAYED_RELEASE_TABLET | Freq: Two times a day (BID) | ORAL | Status: AC

## 2015-08-12 NOTE — Progress Notes (Signed)
EEG Completed; Results Pending  

## 2015-08-12 NOTE — Progress Notes (Signed)
VASCULAR LAB PRELIMINARY  PRELIMINARY  PRELIMINARY  PRELIMINARY  Bilateral lower extremity venous duplex completed.    Preliminary report:  Bilateral:  No evidence of DVT, superficial thrombosis, or Baker's Cyst.   Lavora Brisbon, RVS 08/12/2015, 11:55 AM

## 2015-08-12 NOTE — Procedures (Signed)
EEG report.  Brief clinical history: 39 y.o. male with history of hypertension, gastroesophageal reflux disease, schizophrenia, anxiety disorder presents to the ED with syncopal episode. Patient states 1 day prior to admission was mowing grass most of the day and as he was getting done and raking in the leaves he notices started to get very dizzy with some lightheadedness which was ongoing throughout the day. Patient subsequently went to her friends have weighed noticed that he blacked out and fell. Patient stated that he noticed that he was shaking all over and felt he may have had a seizure. Patient stated that he had about 2-3 of these spells.  Technique: this is a 17 channel routine scalp EEG performed at the bedside with bipolar and monopolar montages arranged in accordance to the international 10/20 system of electrode placement. One channel was dedicated to EKG recording.  The study was performed during wakefulness, drowsiness, and stage 2 sleep. Intermittent photic stimulation was utilized as activating procedure.  Description:In the wakeful state, the best background consisted of a medium amplitude, posterior dominant, well sustained, symmetric and reactive 12 Hz rhythm. Drowsiness demonstrated dropout of the alpha rhythm. Stage 2 sleep showed symmetric and synchronous sleep spindles without intermixed epileptiform discharges. Hyperventilation was not carried out. Intermittent photic stimulation did induce a normal driving response.  No focal or generalized epileptiform discharges noted.  No pathologic areas of slowing seen.  EKG showed sinus rhythm.  Impression: this is a normal awake and asleep EEG. Please, be aware that a normal EEG does not exclude the possibility of epilepsy.  Clinical correlation is advised.   Wyatt Portela, MD [facility]

## 2015-08-12 NOTE — Progress Notes (Signed)
Cameron Oliver to be D/C'd Home per MD order.  Discussed prescriptions and follow up appointments with the patient. Prescriptions given to patient, medication list explained in detail. Pt verbalized understanding.    Medication List    STOP taking these medications        olmesartan 40 MG tablet  Commonly known as:  BENICAR      TAKE these medications        acetaminophen 500 MG tablet  Commonly known as:  TYLENOL  Take 1,000 mg by mouth every 4 (four) hours as needed for mild pain.     amLODipine 5 MG tablet  Commonly known as:  NORVASC  Take 1 tablet (5 mg total) by mouth daily.     benztropine 0.5 MG tablet  Commonly known as:  COGENTIN  Take 0.5 mg by mouth at bedtime.     busPIRone 7.5 MG tablet  Commonly known as:  BUSPAR  Take 7.5 mg by mouth 2 (two) times daily.     cyclobenzaprine 10 MG tablet  Commonly known as:  FLEXERIL  Take 10 mg by mouth at bedtime.     pantoprazole 40 MG tablet  Commonly known as:  PROTONIX  Take 1 tablet (40 mg total) by mouth 2 (two) times daily.        Filed Vitals:   08/12/15 0418  BP: 142/91  Pulse: 70  Temp: 97.8 F (36.6 C)  Resp: 19    Skin clean, dry and intact without evidence of skin break down, no evidence of skin tears noted. IV catheter discontinued intact. Site without signs and symptoms of complications. Dressing and pressure applied. Pt denies pain at this time. No complaints noted.  An After Visit Summary was printed and given to the patient. Patient given bus pass and escorted to bus stop.   Trish Fountain 08/12/2015 4:33 PM

## 2015-08-12 NOTE — Progress Notes (Signed)
OT Cancellation Note  Patient Details Name: Cameron Oliver MRN: [id number] DOB: January 25, 1976   Cancelled Treatment:    Reason Eval/Treat Not Completed: OT screened, no needs identified, will sign off. After chart review and discussion with patient's RN, OT is screening and signing off on patient. According to PT's evaluation, pt is independent with all mobility and gait. Please send text page to OT services if any questions, concerns, or with new orders: [phone] OR call office at [phone]. Thank you for the order.    Tashara Suder , MS, OTR/L, CLT Pager: [phone]  08/12/2015, 2:55 PM

## 2015-08-12 NOTE — Progress Notes (Signed)
  Echocardiogram 2D Echocardiogram has been performed.  Geral Coker 08/12/2015, 1:16 PM

## 2015-08-12 NOTE — Discharge Summary (Signed)
Physician Discharge Summary  Cameron Oliver UEA:[id number] DOB: 02/18/1976 DOA: 08/10/2015  PCP: Dorrene German, MD  Admit date: 08/10/2015 Discharge date: 08/12/2015  Time spent: 45 minutes  Recommendations for Outpatient Follow-up:  PCP Dr.Avbuere in 1 week  Discharge Diagnoses:  Principal Problem:   Syncope   Orthostatic hypotension   ARF (acute renal failure)   Dehydration   Leukocytosis   HTN (hypertension)   Orthostasis   GERD (gastroesophageal reflux disease)   Schizophrenia, schizo-affective type   Anxiety   AKI (acute kidney injury)   Discharge Condition: stable  Diet recommendation: heart healthy  Filed Weights   08/10/15 0408 08/10/15 2055 08/11/15 2105  Weight: 86.183 kg (190 lb) 86.274 kg (190 lb 3.2 oz) 86.909 kg (191 lb 9.6 oz)    History of present illness:  Cameron Oliver is a 39 y.o. male  With history of hypertension, gastroesophageal reflux disease, schizophrenia, anxiety disorder presented to the ED with syncopal episode. Patient stated that 1 day prior to admission was mowing grass most of the day and as he was getting done and raking in the leaves he notices started to get very dizzy with some lightheadedness which was ongoing throughout the day. Patient subsequently went to her friends house where he blacked out and fell.  Hospital Course:  1. Syncope -likely due to Orthostatic hypotension/dehydration -BP and HR improved with hydration -no events on tele, 2D ECho with normal EF and wall motion  2.Intermediate VQ scan  -VQ scan was done on admission as part of syncope workup - patient did not have any pleuritic chest pain, dyspnea, hypoxia or tachycardia - subsequently had CTA chest done after hydration and normalization of creatinine, CTA was negative for PE  3. Aki -due to dehydration, volume depletion, ARB and NSAIDs -resolved with hydration and stopping above meds -Amlodipine started at discharge, instead of ARB  4. GERD/heart  burn -increased protonix to BID with resolution of symptoms, he was uncontrolled on Omeprazole  5. Schizophrenia/anxiety d/o -stable, continue home meds  Discharge Exam: Filed Vitals:   08/12/15 0418  BP: 142/91  Pulse: 70  Temp: 97.8 F (36.6 C)  Resp: 19    General: AAOx3 Cardiovascular: S1S2/RRR Respiratory: CTAB  Discharge Instructions    Current Discharge Medication List    START taking these medications   Details  amLODipine (NORVASC) 5 MG tablet Take 1 tablet (5 mg total) by mouth daily. Qty: 30 tablet, Refills: 0    pantoprazole (PROTONIX) 40 MG tablet Take 1 tablet (40 mg total) by mouth 2 (two) times daily. Qty: 60 tablet, Refills: 1      CONTINUE these medications which have NOT CHANGED   Details  acetaminophen (TYLENOL) 500 MG tablet Take 1,000 mg by mouth every 4 (four) hours as needed for mild pain.    benztropine (COGENTIN) 0.5 MG tablet Take 0.5 mg by mouth at bedtime.    busPIRone (BUSPAR) 7.5 MG tablet Take 7.5 mg by mouth 2 (two) times daily.    cyclobenzaprine (FLEXERIL) 10 MG tablet Take 10 mg by mouth at bedtime.      STOP taking these medications     olmesartan (BENICAR) 40 MG tablet        Allergies  Allergen Reactions  . Bee Venom Swelling   Follow-up Information    Follow up with AVBUERE,EDWIN A, MD. Schedule an appointment as soon as possible for a visit in 1 week.   Specialty:  Internal Medicine   Contact information:   [address] [place]  [place] [address] [phone]        The results of significant diagnostics from this hospitalization (including imaging, microbiology, ancillary and laboratory) are listed below for reference.    Significant Diagnostic Studies: Ct Abdomen Pelvis Wo Contrast  08/10/2015   CLINICAL DATA:  Abdominal pain with blood in stools.  EXAM: CT ABDOMEN AND PELVIS WITHOUT CONTRAST  TECHNIQUE: Multidetector CT imaging of the abdomen and pelvis was performed following the standard protocol  without IV contrast.  COMPARISON:  None currently available  FINDINGS: Lower chest and abdominal wall: Mild dependent atelectasis. No indication pneumonia.  Hepatobiliary: No focal liver abnormality.No evidence of biliary obstruction or stone.  Pancreas: Unremarkable.  Spleen: Unremarkable.  Adrenals/Urinary Tract: Negative adrenals. No hydronephrosis or stone. Unremarkable bladder.  Reproductive:No pathologic findings.  Stomach/Bowel: No obstruction or inflammatory change. No appendicitis. No diverticulosis to explain bloody stools.  Vascular/Lymphatic: No acute vascular abnormality. No mass or adenopathy.  Peritoneal: No ascites or pneumoperitoneum.  Musculoskeletal: No acute abnormalities.  IMPRESSION: Negative.  No explanation for symptoms.   Electronically Signed   By: Marnee Spring M.D.   On: 08/10/2015 08:35   Ct Head Wo Contrast  08/10/2015   CLINICAL DATA:  Seizure, syncope  EXAM: CT HEAD WITHOUT CONTRAST  TECHNIQUE: Contiguous axial images were obtained from the base of the skull through the vertex without intravenous contrast.  COMPARISON:  None.  FINDINGS: No acute hemorrhage, infarct, or mass lesion is identified. No midline shift. Ventricles are normal in size. Orbits and are unremarkable. Mild pansinusitis. No skull fracture.  IMPRESSION: No acute intracranial abnormality.  Mild sinusitis.   Electronically Signed   By: Christiana Pellant M.D.   On: 08/10/2015 11:39   Ct Angio Chest Pe W/cm &/or Wo Cm  08/12/2015   CLINICAL DATA:  Right-sided chest pain.  EXAM: CT ANGIOGRAPHY CHEST WITH CONTRAST  TECHNIQUE: Multidetector CT imaging of the chest was performed using the standard protocol during bolus administration of intravenous contrast. Multiplanar CT image reconstructions and MIPs were obtained to evaluate the vascular anatomy.  CONTRAST:  OMNIPAQUE IOHEXOL 350 MG/ML SOLN  COMPARISON:  None.  FINDINGS: There is no pulmonary embolism identified within the main, lobar, or segmental pulmonary  arteries bilaterally.  Thoracic aorta is normal in caliber and configuration. No aortic aneurysm or dissection. Heart size is normal. No pericardial effusion. Scattered small lymph nodes noted within the mediastinum. No mass or enlarged lymph nodes within the mediastinum or perihilar regions.  There is minimal dependent atelectasis at each lung base. Lungs otherwise clear. No pneumonia or pleural effusion. No pneumothorax.  Limited images of the upper abdomen are unremarkable. No osseous abnormality seen.  Review of the MIP images confirms the above findings.  IMPRESSION: 1. Overall, no evidence of acute intrathoracic abnormality. 2. No pulmonary embolism. 3. Heart size is normal.  No pericardial effusion. 4. No aortic aneurysm or dissection. 5. No pneumonia or pleural effusion.   Electronically Signed   By: Bary Richard M.D.   On: 08/12/2015 09:40   US Renal  08/10/2015   CLINICAL DATA:  Acute renal failure  EXAM: RENAL / URINARY TRACT ULTRASOUND COMPLETE  COMPARISON:  08/10/2015  FINDINGS: Right Kidney:  Length: 10.2 cm. Echogenicity within normal limits. No mass or hydronephrosis visualized.  Left Kidney:  Length: 11.4 cm. Echogenicity within normal limits. No mass or hydronephrosis visualized.  Bladder:  Appears normal for degree of bladder distention.  IMPRESSION: 1. Normal renal sonogram.   Electronically Signed   By: Ladona Ridgel  Bradly Chris M.D.   On: 08/10/2015 11:49   Nm Pulmonary Perf And Vent  08/10/2015   CLINICAL DATA:  Shortness of breath  EXAM: NUCLEAR MEDICINE VENTILATION - PERFUSION LUNG SCAN  TECHNIQUE: Ventilation images were obtained in multiple projections using inhaled aerosol Tc-87m DTPA. Perfusion images were obtained in multiple projections after intravenous injection of Tc-34m MAA.  RADIOPHARMACEUTICALS:  36.2 mCi Technetium-23m DTPA aerosol inhalation and 4.6 mCi Technetium-66m MAA IV  COMPARISON:  Chest radiograph same date  FINDINGS: Ventilation: Inhomogeneous ventilation with obscuration  of detail over the central lungs due to large amount of clumping of radiotracer.  Perfusion: No wedge shaped peripheral perfusion defects to suggest acute pulmonary embolism. Moderately inhomogeneous per fusion without wedge-shaped segmental defect.  IMPRESSION: Inhomogeneous ventilation and perfusion images without matched defects. Intermediate probability for pulmonary embolism.   Electronically Signed   By: Christiana Pellant M.D.   On: 08/10/2015 14:26   Dg Chest Port 1 View  08/10/2015   CLINICAL DATA:  Dizziness and chest pain  EXAM: PORTABLE CHEST - 1 VIEW  COMPARISON:  None.  FINDINGS: Normal heart size and mediastinal contours. No acute infiltrate or edema. No effusion or pneumothorax. No acute osseous findings.  IMPRESSION: Negative portable chest   Electronically Signed   By: Marnee Spring M.D.   On: 08/10/2015 09:59    Microbiology: Recent Results (from the past 240 hour(s))  Blood culture (routine x 2)     Status: None (Preliminary result)   Collection Time: 08/10/15  6:40 AM  Result Value Ref Range Status   Specimen Description BLOOD LEFT HAND  Final   Special Requests BOTTLES DRAWN AEROBIC AND ANAEROBIC 3CC  Final   Culture NO GROWTH 2 DAYS  Final   Report Status PENDING  Incomplete  Blood culture (routine x 2)     Status: None (Preliminary result)   Collection Time: 08/10/15  6:43 AM  Result Value Ref Range Status   Specimen Description BLOOD LEFT ARM  Final   Special Requests BOTTLES DRAWN AEROBIC AND ANAEROBIC 5CC  Final   Culture NO GROWTH 2 DAYS  Final   Report Status PENDING  Incomplete     Labs: Basic Metabolic Panel:  Recent Labs Lab 08/10/15 0435 08/10/15 1910 08/11/15 0620 08/12/15 0416  NA 140 139 141 139  K 4.0 3.9 4.4 4.1  CL 97* 106 110 108  CO2 GLUCOSE 125* 95 91 87  BUN CREATININE 3.79* 1.79* 1.37* 1.21  CALCIUM 9.5 8.2* 8.4* 8.5*  MG  --  2.1  --   --   PHOS  --  2.3*  --   --    Liver Function Tests:  Recent  Labs Lab 08/10/15 0649 08/11/15 0620  AST 22 18  ALT 25 18  ALKPHOS 90 64  BILITOT 0.8 0.3  PROT 7.1 5.4*  ALBUMIN 4.1 2.9*   No results for input(s): LIPASE, AMYLASE in the last 168 hours. No results for input(s): AMMONIA in the last 168 hours. CBC:  Recent Labs Lab 08/10/15 0435 08/10/15 1910 08/11/15 0620 08/12/15 0416  WBC 20.0* 13.7* 9.7 8.7  HGB 17.5* 14.5 12.9* 12.1*  HCT 48.9 42.6 39.3 35.7*  MCV 88.7 92.0 92.9 91.8  PLT 376 284 238 213   Cardiac Enzymes:  Recent Labs Lab 08/10/15 0457  CKTOTAL 120   BNP: BNP (last 3 results) No results for input(s): BNP in the last 8760 hours.  ProBNP (last 3 results) No  results for input(s): PROBNP in the last 8760 hours.  CBG:  Recent Labs Lab 08/10/15 0425  GLUCAP 135*       Signed:  Nitzia Perren  [facility] 08/12/2015, 2:32 PM

## 2015-08-15 LAB — CULTURE, BLOOD (ROUTINE X 2)
CULTURE: NO GROWTH
Culture: NO GROWTH

## 2016-12-05 IMAGING — CT CT ABD-PELV W/O CM
2 of 4 series · 16 of 46 positions shown, 18 images · non-contrast
Comparison: None currently available

CLINICAL DATA: Abdominal pain with blood in stools.

EXAM:
CT ABDOMEN AND PELVIS WITHOUT CONTRAST
TECHNIQUE: Multidetector CT imaging of the abdomen and pelvis was performed
following the standard protocol without IV contrast.

[Series 2: abd/ pelvis 5.0 i30f 1 · axial · 0.82mm/px · z∈[+822,+1287]mm · 13 of 103 slices shown, 15 images]
[im 5/103  soft-tissue]
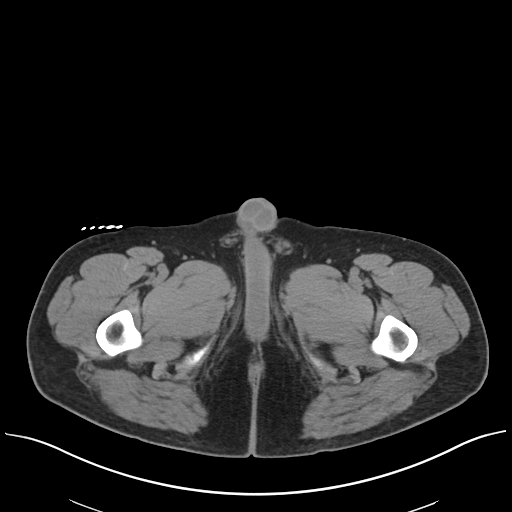
[im 5/103  bone]
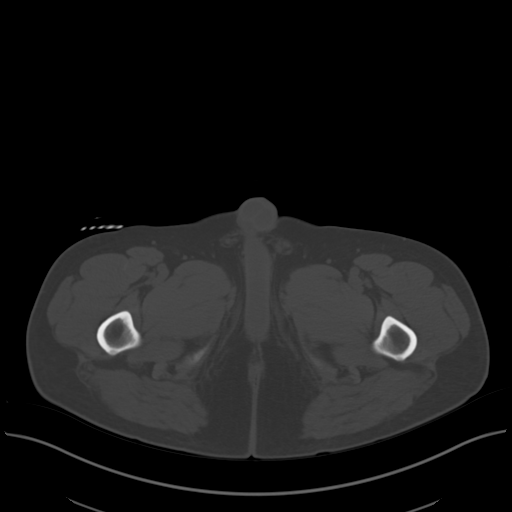
[im 13/103  soft-tissue]
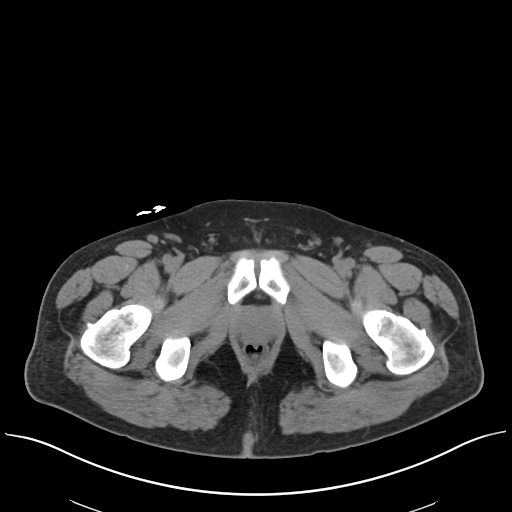
[im 21/103  soft-tissue]
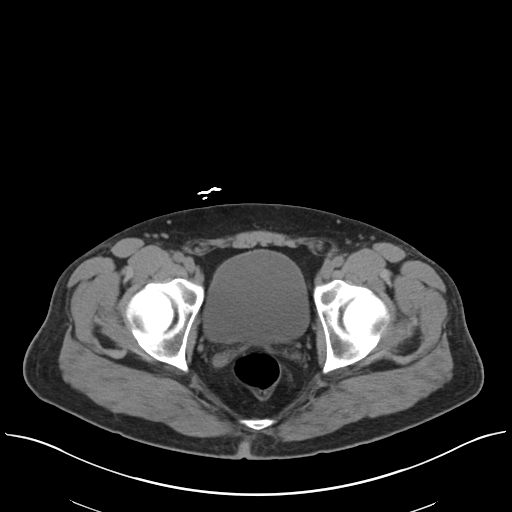
[im 29/103  soft-tissue]
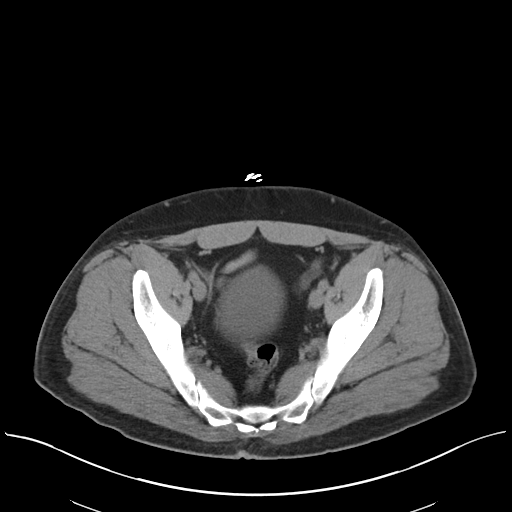
[im 37/103  soft-tissue]
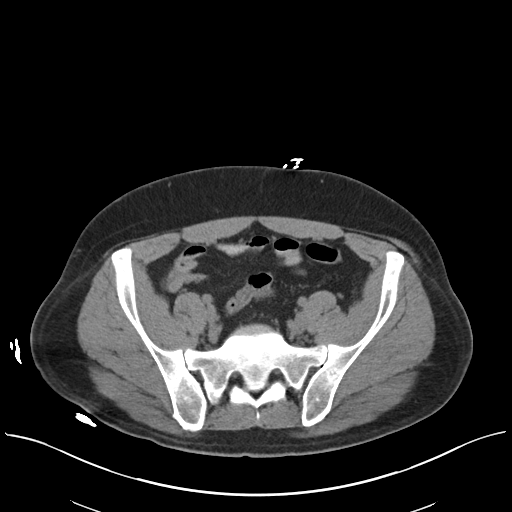
[im 45/103  soft-tissue]
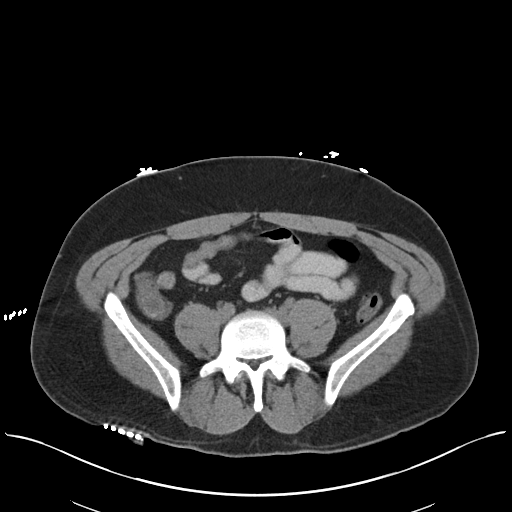
[im 54/103  soft-tissue]
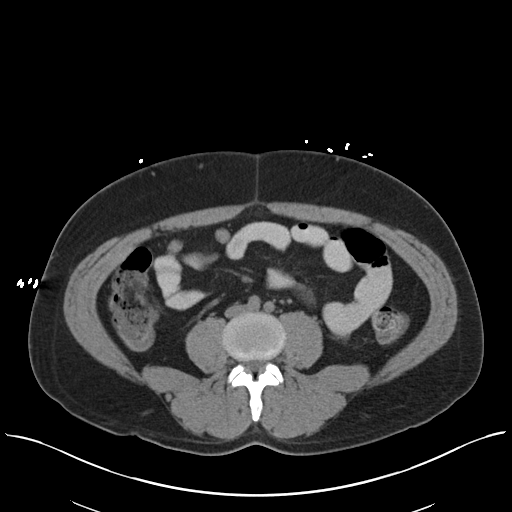
[im 58/103  soft-tissue]
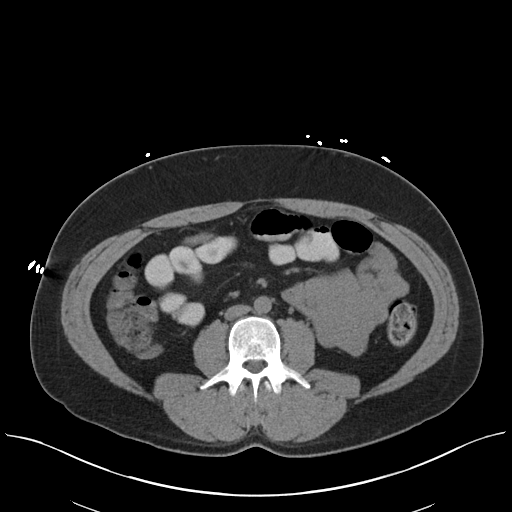
[im 66/103  soft-tissue]
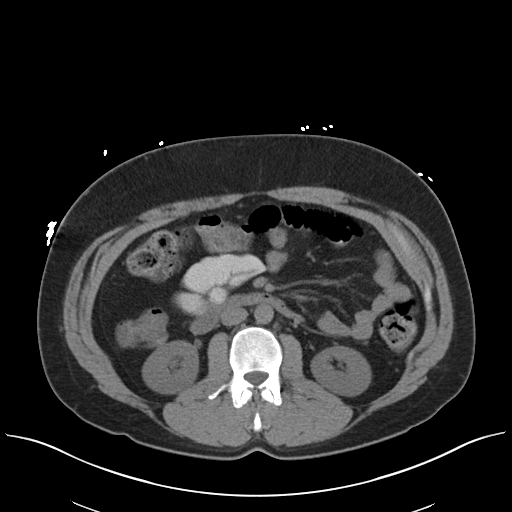
[im 66/103  bone]
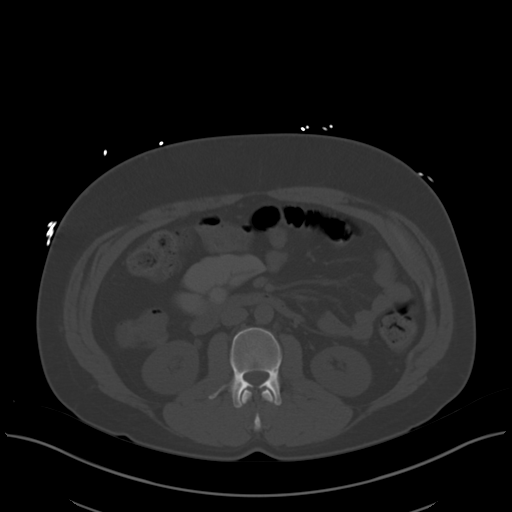
[im 74/103  soft-tissue]
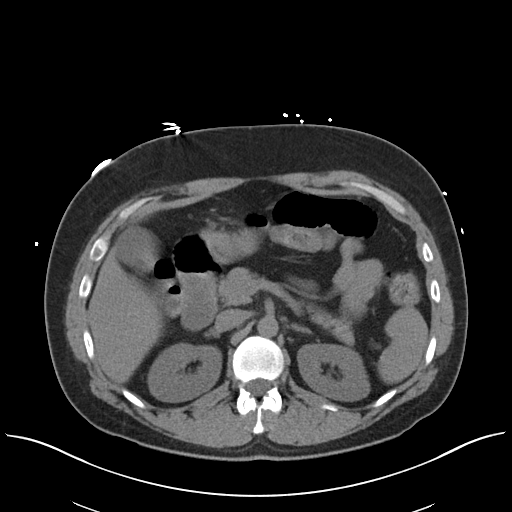
[im 82/103  soft-tissue]
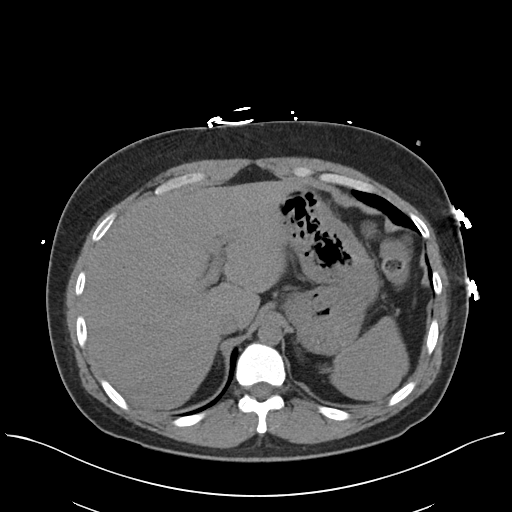
[im 90/103  soft-tissue]
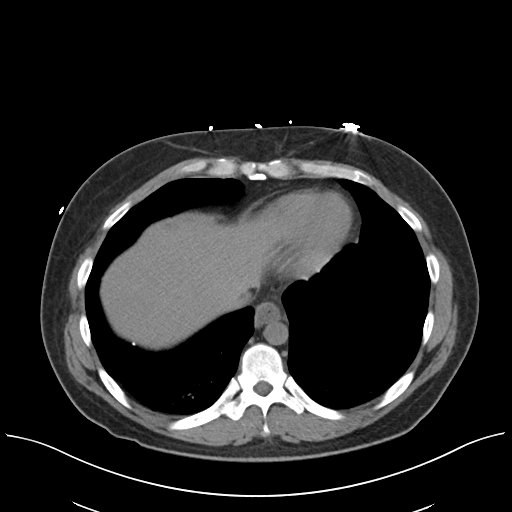
[im 98/103  soft-tissue]
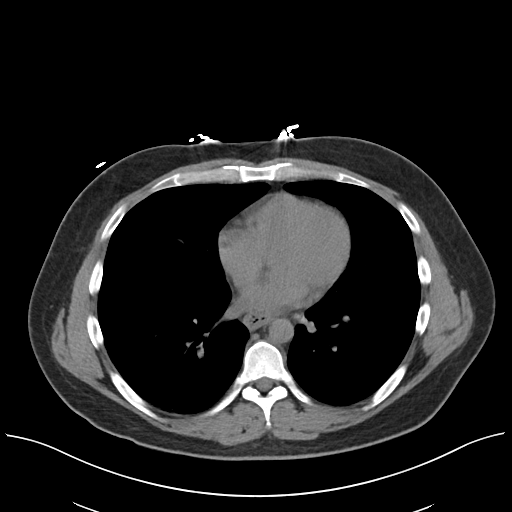

[Series 5: coronals · coronal · 0.70mm/px · 3 of 124 slices shown]
[im 42/124  soft-tissue]
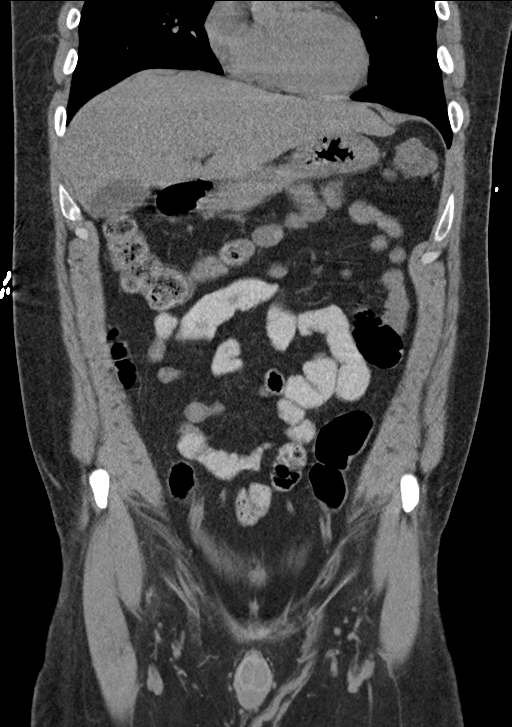
[im 55/124  soft-tissue]
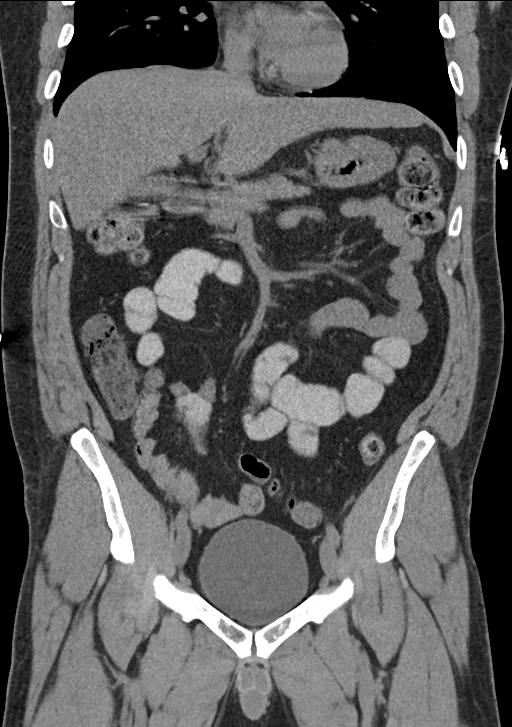
[im 69/124  soft-tissue]
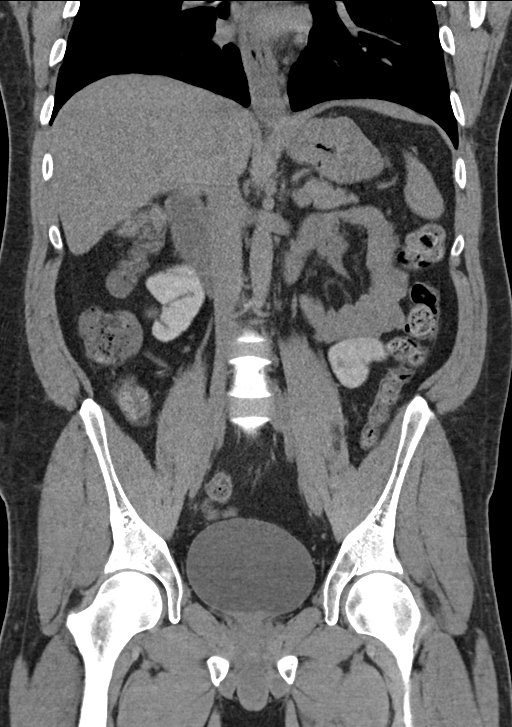

[16 of 46 positions shown; findings below may reference images not displayed]

FINDINGS: Lower chest and abdominal wall: Mild dependent atelectasis. No
indication pneumonia.

Hepatobiliary: No focal liver abnormality.No evidence of biliary
obstruction or stone.

Pancreas: Unremarkable.

Spleen: Unremarkable.

Adrenals/Urinary Tract: Negative adrenals. No hydronephrosis or
stone. Unremarkable bladder.

Reproductive:No pathologic findings.

Stomach/Bowel: No obstruction or inflammatory change. No
appendicitis. No diverticulosis to explain bloody stools.

Vascular/Lymphatic: No acute vascular abnormality. No mass or
adenopathy.

Peritoneal: No ascites or pneumoperitoneum.

Musculoskeletal: No acute abnormalities.
IMPRESSION: Negative.  No explanation for symptoms.

## 2016-12-07 IMAGING — CT CT ANGIO CHEST
2 of 9 series · 18 of 46 positions shown · IV contrast (Omni 300)
Comparison: None.

CLINICAL DATA: Right-sided chest pain.

EXAM:
CT ANGIOGRAPHY CHEST WITH CONTRAST
TECHNIQUE: Multidetector CT imaging of the chest was performed using the
standard protocol during bolus administration of intravenous
contrast. Multiplanar CT image reconstructions and MIPs were
obtained to evaluate the vascular anatomy.
CONTRAST:  100mL OMNIPAQUE IOHEXOL 350 MG/ML SOLN

[Series 5: thins · axial · 0.77mm/px · z∈[+1224,+1430]mm · 15 of 232 slices shown]
[im 13/232  lung]
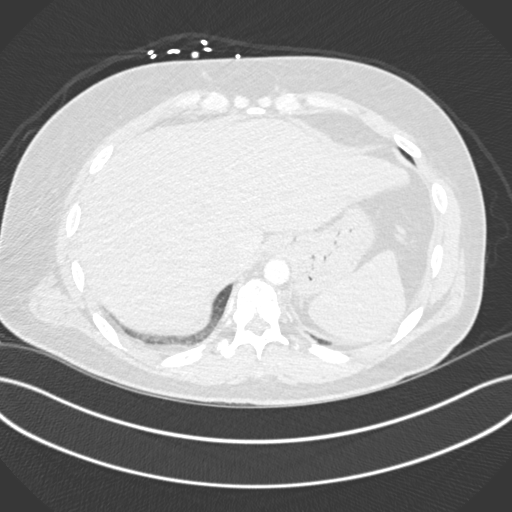
[im 26/232  soft-tissue]
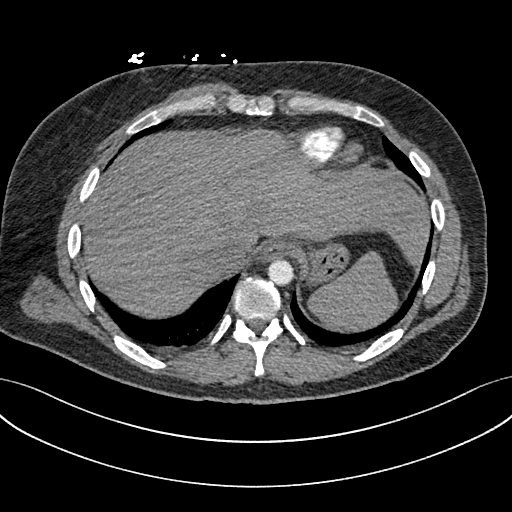
[im 39/232  lung]
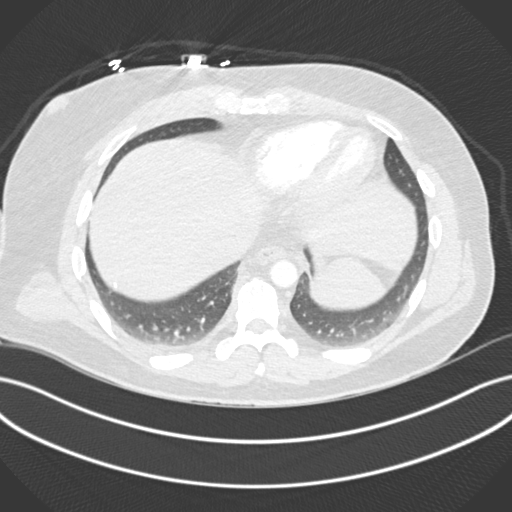
[im 52/232  soft-tissue]
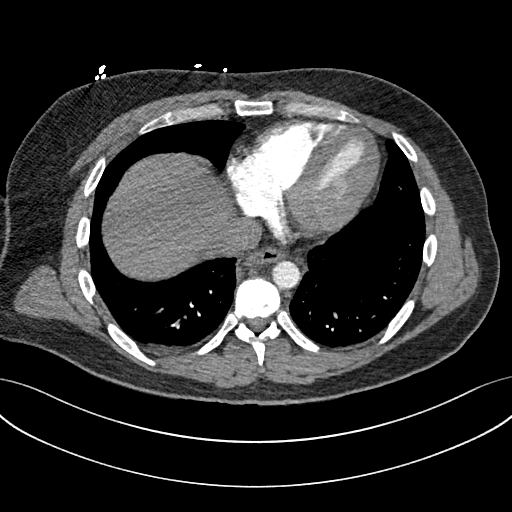
[im 78/232  lung]
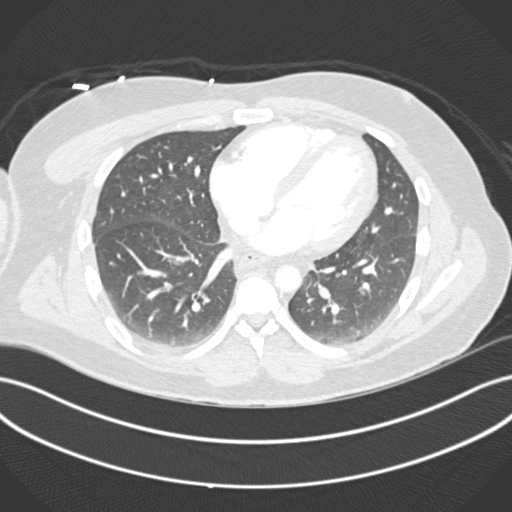
[im 90/232  soft-tissue]
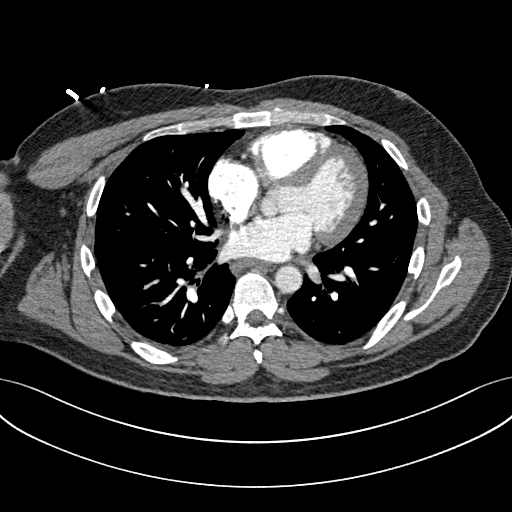
[im 103/232  lung]
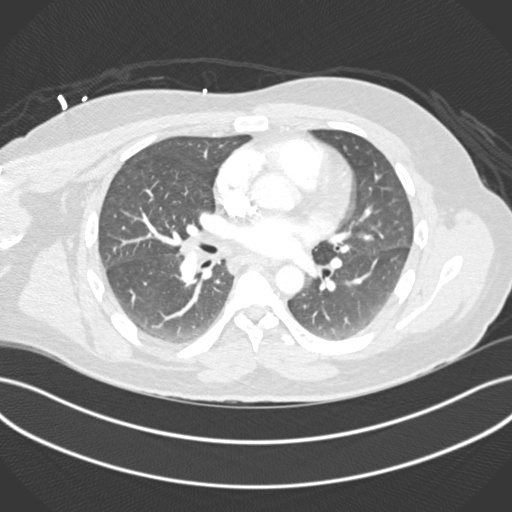
[im 116/232  soft-tissue]
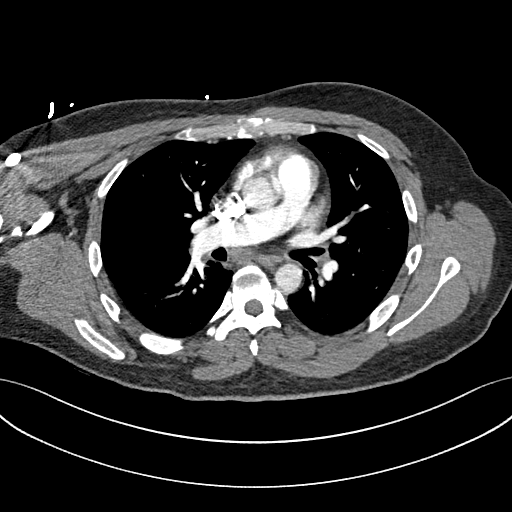
[im 129/232  lung]
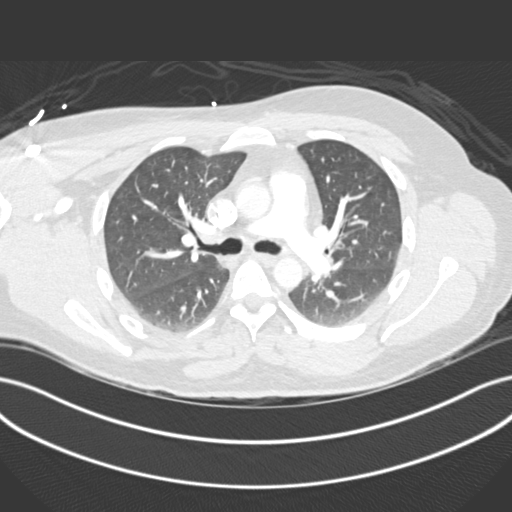
[im 142/232  soft-tissue]
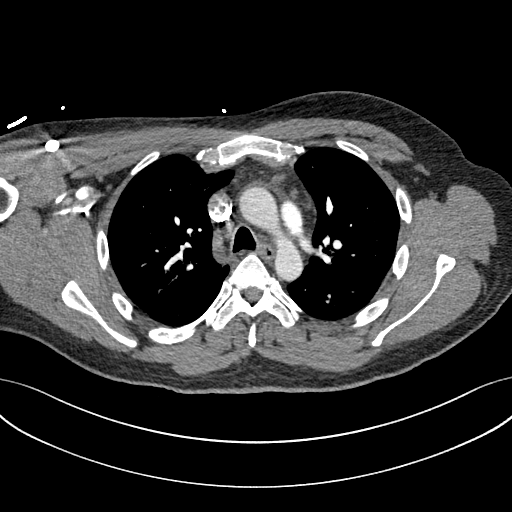
[im 155/232  lung]
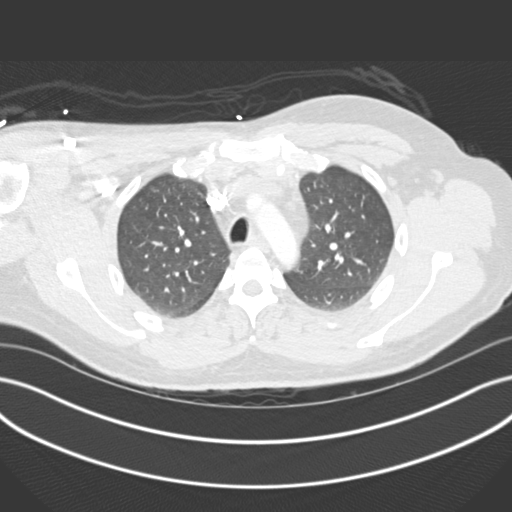
[im 180/232  soft-tissue]
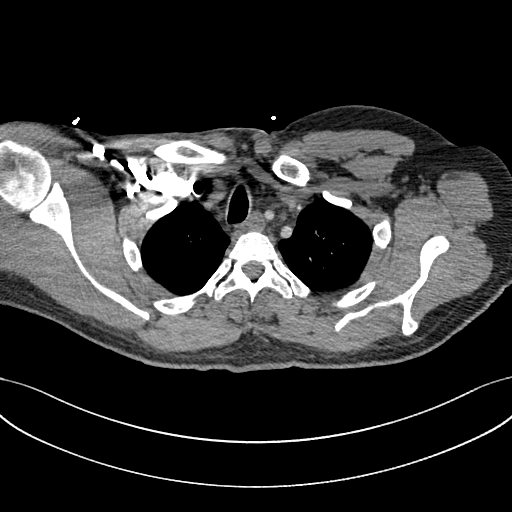
[im 193/232  lung]
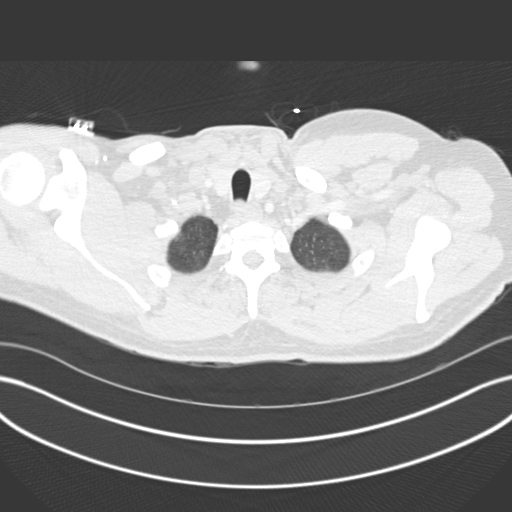
[im 206/232  soft-tissue]
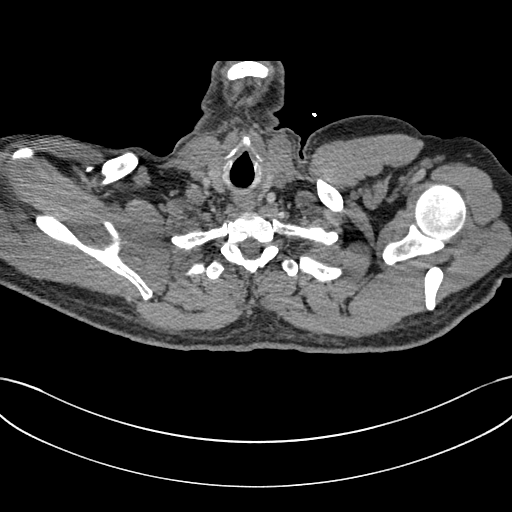
[im 219/232  lung]
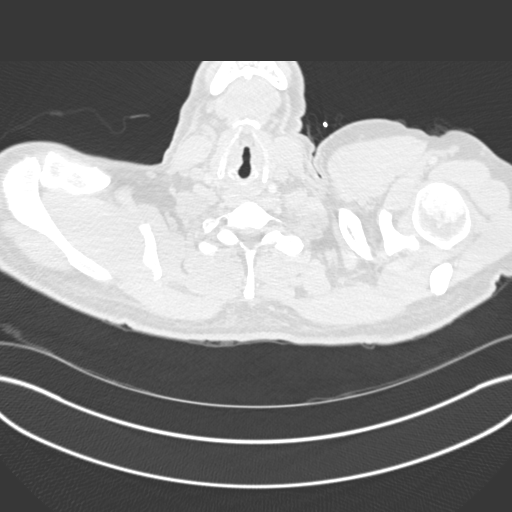

[Series 7: coronal mpr · coronal · 0.57mm/px · 3 of 150 slices shown]
[im 38/150  soft-tissue]
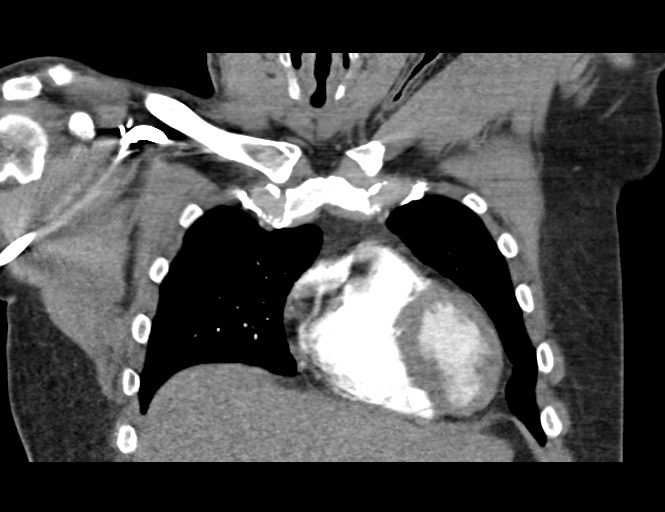
[im 75/150  soft-tissue]
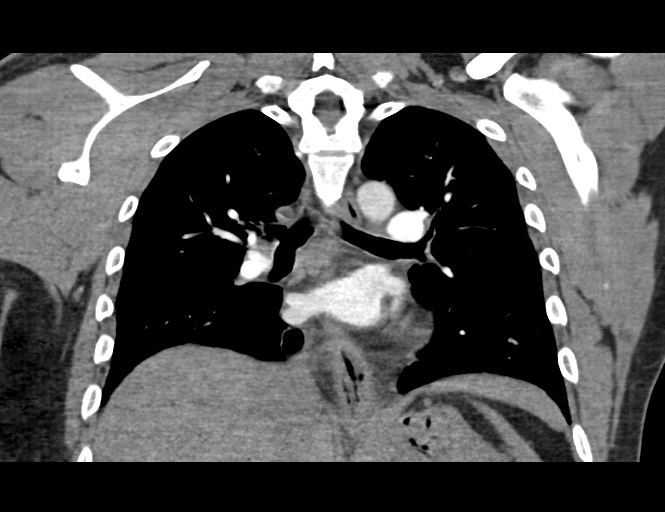
[im 112/150  soft-tissue]
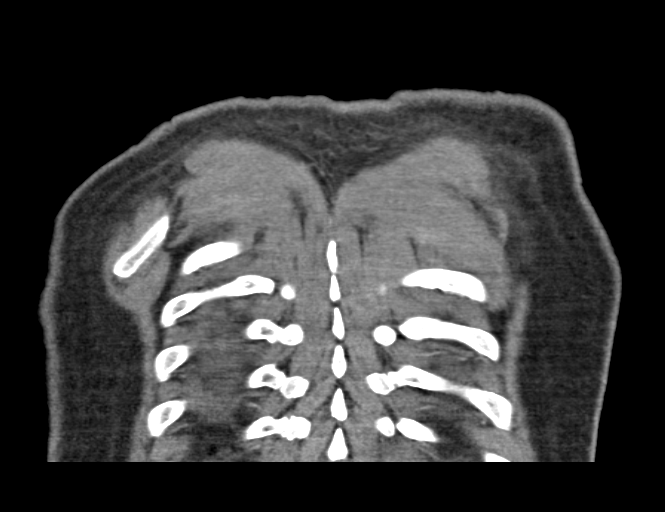

[18 of 46 positions shown; findings below may reference images not displayed]

FINDINGS: There is no pulmonary embolism identified within the main, lobar, or
segmental pulmonary arteries bilaterally.

Thoracic aorta is normal in caliber and configuration. No aortic
aneurysm or dissection. Heart size is normal. No pericardial
effusion. Scattered small lymph nodes noted within the mediastinum.
No mass or enlarged lymph nodes within the mediastinum or perihilar
regions.

There is minimal dependent atelectasis at each lung base. Lungs
otherwise clear. No pneumonia or pleural effusion. No pneumothorax.

Limited images of the upper abdomen are unremarkable. No osseous
abnormality seen.

Review of the MIP images confirms the above findings.
IMPRESSION: 1. Overall, no evidence of acute intrathoracic abnormality.
2. No pulmonary embolism.
3. Heart size is normal.  No pericardial effusion.
4. No aortic aneurysm or dissection.
5. No pneumonia or pleural effusion.
